# Patient Record
Sex: Female | Born: 1935 | Race: White | Hispanic: No | Marital: Married | Smoking: Never smoker
Health system: Southern US, Community
[De-identification: ages and names within clinical notes are randomized; demographics above are authoritative.]

## PROBLEM LIST (undated history)

## (undated) DIAGNOSIS — I10 Essential (primary) hypertension: Secondary | ICD-10-CM

## (undated) DIAGNOSIS — I639 Cerebral infarction, unspecified: Secondary | ICD-10-CM

## (undated) HISTORY — DX: Essential (primary) hypertension: I10

## (undated) HISTORY — PX: BRAIN SURGERY: SHX531

## (undated) HISTORY — DX: Cerebral infarction, unspecified: I63.9

---

## 2005-08-09 ENCOUNTER — Ambulatory Visit (HOSPITAL_COMMUNITY): Admission: RE | Admit: 2005-08-09 | Discharge: 2005-08-09 | Payer: Self-pay | Admitting: Family Medicine

## 2006-11-12 ENCOUNTER — Emergency Department (HOSPITAL_COMMUNITY): Admission: EM | Admit: 2006-11-12 | Discharge: 2006-11-12 | Payer: Self-pay | Admitting: Emergency Medicine

## 2006-11-15 ENCOUNTER — Ambulatory Visit: Payer: Self-pay | Admitting: Internal Medicine

## 2006-11-27 ENCOUNTER — Encounter (INDEPENDENT_AMBULATORY_CARE_PROVIDER_SITE_OTHER): Payer: Self-pay | Admitting: *Deleted

## 2006-11-27 ENCOUNTER — Ambulatory Visit: Payer: Self-pay | Admitting: Internal Medicine

## 2006-11-27 ENCOUNTER — Ambulatory Visit (HOSPITAL_COMMUNITY): Admission: RE | Admit: 2006-11-27 | Discharge: 2006-11-27 | Payer: Self-pay | Admitting: Internal Medicine

## 2010-04-08 ENCOUNTER — Ambulatory Visit: Payer: Self-pay | Admitting: Pulmonary Disease

## 2010-04-08 ENCOUNTER — Encounter (INDEPENDENT_AMBULATORY_CARE_PROVIDER_SITE_OTHER): Payer: Self-pay | Admitting: Neurosurgery

## 2010-04-08 ENCOUNTER — Encounter: Payer: Self-pay | Admitting: Emergency Medicine

## 2010-04-08 ENCOUNTER — Inpatient Hospital Stay (HOSPITAL_COMMUNITY): Admission: EM | Admit: 2010-04-08 | Discharge: 2010-04-12 | Payer: Self-pay | Admitting: Neurosurgery

## 2010-04-11 ENCOUNTER — Ambulatory Visit: Payer: Self-pay | Admitting: Physical Medicine & Rehabilitation

## 2010-04-12 ENCOUNTER — Ambulatory Visit: Payer: Self-pay | Admitting: Physical Medicine & Rehabilitation

## 2010-04-12 ENCOUNTER — Inpatient Hospital Stay (HOSPITAL_COMMUNITY)
Admission: RE | Admit: 2010-04-12 | Discharge: 2010-04-20 | Payer: Self-pay | Admitting: Physical Medicine & Rehabilitation

## 2010-04-12 ENCOUNTER — Encounter (INDEPENDENT_AMBULATORY_CARE_PROVIDER_SITE_OTHER): Payer: Self-pay | Admitting: Neurology

## 2010-04-26 ENCOUNTER — Encounter (HOSPITAL_COMMUNITY)
Admission: RE | Admit: 2010-04-26 | Discharge: 2010-05-26 | Payer: Self-pay | Admitting: Physical Medicine & Rehabilitation

## 2010-05-12 ENCOUNTER — Encounter: Admission: RE | Admit: 2010-05-12 | Discharge: 2010-05-12 | Payer: Self-pay | Admitting: Neurosurgery

## 2010-05-23 ENCOUNTER — Encounter
Admission: RE | Admit: 2010-05-23 | Discharge: 2010-05-26 | Payer: Self-pay | Admitting: Physical Medicine & Rehabilitation

## 2010-05-26 ENCOUNTER — Ambulatory Visit: Payer: Self-pay | Admitting: Physical Medicine & Rehabilitation

## 2010-06-24 ENCOUNTER — Encounter: Admission: RE | Admit: 2010-06-24 | Discharge: 2010-06-24 | Payer: Self-pay | Admitting: Neurosurgery

## 2010-11-18 LAB — GLUCOSE, CAPILLARY
Glucose-Capillary: 100 mg/dL — ABNORMAL HIGH (ref 70–99)
Glucose-Capillary: 103 mg/dL — ABNORMAL HIGH (ref 70–99)
Glucose-Capillary: 106 mg/dL — ABNORMAL HIGH (ref 70–99)
Glucose-Capillary: 107 mg/dL — ABNORMAL HIGH (ref 70–99)
Glucose-Capillary: 119 mg/dL — ABNORMAL HIGH (ref 70–99)
Glucose-Capillary: 122 mg/dL — ABNORMAL HIGH (ref 70–99)

## 2010-11-18 LAB — BLOOD GAS, ARTERIAL
Acid-base deficit: 0.7 mmol/L (ref 0.0–2.0)
Bicarbonate: 20.9 mEq/L (ref 20.0–24.0)
Bicarbonate: 22.2 mEq/L (ref 20.0–24.0)
Drawn by: 275531
Drawn by: 275531
Drawn by: 29017
FIO2: 0.3 %
FIO2: 0.3 %
O2 Saturation: 98.9 %
PEEP: 5 cmH2O
PEEP: 5 cmH2O
Patient temperature: 98.6
Pressure support: 5 cmH2O
RATE: 14 resp/min
RATE: 14 resp/min
pCO2 arterial: 28.8 mmHg — ABNORMAL LOW (ref 35.0–45.0)
pCO2 arterial: 36.9 mmHg (ref 35.0–45.0)
pH, Arterial: 7.372 (ref 7.350–7.400)
pO2, Arterial: 101 mmHg — ABNORMAL HIGH (ref 80.0–100.0)
pO2, Arterial: 190 mmHg — ABNORMAL HIGH (ref 80.0–100.0)

## 2010-11-18 LAB — CBC
HCT: 30.6 % — ABNORMAL LOW (ref 36.0–46.0)
Hemoglobin: 10.3 g/dL — ABNORMAL LOW (ref 12.0–15.0)
Hemoglobin: 11.1 g/dL — ABNORMAL LOW (ref 12.0–15.0)
MCH: 29.7 pg (ref 26.0–34.0)
MCH: 30 pg (ref 26.0–34.0)
MCH: 30.1 pg (ref 26.0–34.0)
MCH: 30.5 pg (ref 26.0–34.0)
MCHC: 33.2 g/dL (ref 30.0–36.0)
MCV: 88.9 fL (ref 78.0–100.0)
Platelets: 175 10*3/uL (ref 150–400)
Platelets: 188 10*3/uL (ref 150–400)
Platelets: 219 10*3/uL (ref 150–400)
Platelets: 233 10*3/uL (ref 150–400)
RBC: 3.03 MIL/uL — ABNORMAL LOW (ref 3.87–5.11)
RBC: 3.15 MIL/uL — ABNORMAL LOW (ref 3.87–5.11)
RBC: 3.43 MIL/uL — ABNORMAL LOW (ref 3.87–5.11)
RBC: 3.69 MIL/uL — ABNORMAL LOW (ref 3.87–5.11)
RDW: 12.8 % (ref 11.5–15.5)
RDW: 12.8 % (ref 11.5–15.5)
RDW: 13 % (ref 11.5–15.5)
WBC: 10.5 10*3/uL (ref 4.0–10.5)
WBC: 13.8 10*3/uL — ABNORMAL HIGH (ref 4.0–10.5)
WBC: 7.5 10*3/uL (ref 4.0–10.5)

## 2010-11-18 LAB — COMPREHENSIVE METABOLIC PANEL
ALT: 16 U/L (ref 0–35)
AST: 27 U/L (ref 0–37)
AST: 42 U/L — ABNORMAL HIGH (ref 0–37)
Albumin: 2.5 g/dL — ABNORMAL LOW (ref 3.5–5.2)
Albumin: 3.1 g/dL — ABNORMAL LOW (ref 3.5–5.2)
CO2: 22 mEq/L (ref 19–32)
Calcium: 8.3 mg/dL — ABNORMAL LOW (ref 8.4–10.5)
Chloride: 100 mEq/L (ref 96–112)
Creatinine, Ser: 0.72 mg/dL (ref 0.4–1.2)
Creatinine, Ser: 0.95 mg/dL (ref 0.4–1.2)
GFR calc Af Amer: >60 mL/min (ref >60)
GFR calc Af Amer: >60 mL/min (ref >60)
GFR calc non Af Amer: 57 mL/min — ABNORMAL LOW (ref >60)
Sodium: 133 mEq/L — ABNORMAL LOW (ref 135–145)
Total Bilirubin: 0.5 mg/dL (ref 0.3–1.2)
Total Protein: 5.6 g/dL — ABNORMAL LOW (ref 6.0–8.3)

## 2010-11-18 LAB — BASIC METABOLIC PANEL
BUN: 10 mg/dL (ref 6–23)
BUN: 10 mg/dL (ref 6–23)
BUN: 5 mg/dL — ABNORMAL LOW (ref 6–23)
CO2: 25 mEq/L (ref 19–32)
CO2: 29 mEq/L (ref 19–32)
Calcium: 8.8 mg/dL (ref 8.4–10.5)
Calcium: 8.1 mg/dL — ABNORMAL LOW (ref 8.4–10.5)
Calcium: 8.7 mg/dL (ref 8.4–10.5)
Calcium: 8.9 mg/dL (ref 8.4–10.5)
Chloride: 104 mEq/L (ref 96–112)
Creatinine, Ser: 0.77 mg/dL (ref 0.4–1.2)
Creatinine, Ser: 0.65 mg/dL (ref 0.4–1.2)
Creatinine, Ser: 0.75 mg/dL (ref 0.4–1.2)
GFR calc Af Amer: >60 mL/min (ref >60)
GFR calc Af Amer: >60 mL/min (ref >60)
GFR calc Af Amer: >60 mL/min (ref >60)
GFR calc non Af Amer: >60 mL/min (ref >60)
GFR calc non Af Amer: 57 mL/min — ABNORMAL LOW (ref >60)
GFR calc non Af Amer: >60 mL/min (ref >60)
Glucose, Bld: 100 mg/dL — ABNORMAL HIGH (ref 70–99)
Potassium: 3.6 mEq/L (ref 3.5–5.1)
Potassium: 3.8 mEq/L (ref 3.5–5.1)
Sodium: 135 mEq/L (ref 135–145)
Sodium: 137 mEq/L (ref 135–145)
Sodium: 137 mEq/L (ref 135–145)

## 2010-11-18 LAB — APTT: aPTT: 23 seconds — ABNORMAL LOW (ref 24–37)

## 2010-11-18 LAB — CROSSMATCH
ABO/RH(D): A POS
Antibody Screen: NEGATIVE

## 2010-11-18 LAB — POCT I-STAT 7, (LYTES, BLD GAS, ICA,H+H)
Bicarbonate: 27.3 mEq/L — ABNORMAL HIGH (ref 20.0–24.0)
Hemoglobin: 11.9 g/dL — ABNORMAL LOW (ref 12.0–15.0)
Sodium: 130 mEq/L — ABNORMAL LOW (ref 135–145)
TCO2: 29 mmol/L (ref 0–100)
pH, Arterial: 7.417 — ABNORMAL HIGH (ref 7.350–7.400)
pO2, Arterial: 488 mmHg — ABNORMAL HIGH (ref 80.0–100.0)

## 2010-11-18 LAB — DIFFERENTIAL
Basophils Absolute: 0 10*3/uL (ref 0.0–0.1)
Basophils Relative: 0 % (ref 0–1)
Eosinophils Relative: 1 % (ref 0–5)
Lymphocytes Relative: 18 % (ref 12–46)
Lymphocytes Relative: 18 % (ref 12–46)
Lymphs Abs: 1.1 10*3/uL (ref 0.7–4.0)
Monocytes Absolute: 0.7 10*3/uL (ref 0.1–1.0)
Neutro Abs: 4.3 10*3/uL (ref 1.7–7.7)
Neutro Abs: 7.8 10*3/uL — ABNORMAL HIGH (ref 1.7–7.7)

## 2010-11-18 LAB — LIPID PANEL
HDL: 38 mg/dL — ABNORMAL LOW (ref >39)
Total CHOL/HDL Ratio: 4.2 RATIO
Triglycerides: 90 mg/dL (ref <150)

## 2010-11-18 LAB — PHOSPHORUS
Phosphorus: 1.7 mg/dL — ABNORMAL LOW (ref 2.3–4.6)
Phosphorus: 2.6 mg/dL (ref 2.3–4.6)

## 2010-11-18 LAB — HEMOGLOBIN A1C
Hgb A1c MFr Bld: 5.9 % — ABNORMAL HIGH (ref <5.7)
Mean Plasma Glucose: 123 mg/dL — ABNORMAL HIGH (ref <117)

## 2010-11-18 LAB — MAGNESIUM: Magnesium: 2.3 mg/dL (ref 1.5–2.5)

## 2010-11-18 LAB — MRSA PCR SCREENING: MRSA by PCR: NEGATIVE

## 2010-11-18 LAB — HEMOCCULT GUIAC POC 1CARD (OFFICE): Fecal Occult Bld: NEGATIVE

## 2010-11-18 LAB — PROTIME-INR: Prothrombin Time: 13.2 seconds (ref 11.6–15.2)

## 2011-01-20 NOTE — Op Note (Signed)
NAMESHEVA, MCDOUGLE                  ACCOUNT NO.:  [id number]   MEDICAL RECORD NO.:  [id number]          PATIENT TYPE:  AMB   LOCATION:  DAY                           FACILITY:  [facility]   PHYSICIAN:  Lionel December, M.D.    DATE OF BIRTH:  1930-03-26   DATE OF PROCEDURE:  11/27/2006  DATE OF DISCHARGE:                               OPERATIVE REPORT   PROCEDURE:  Colonoscopy.   INDICATION:  Zeriyah is a 77 year old Caucasian female who is undergoing  average risk screening colonoscopy.  Procedure risks were reviewed with  the patient and informed consent was obtained.   MEDS CONSCIOUS SEDATION:  1. Demerol 50 mg IV.  2. Versed 6 mg IV.  3. Xylocaine jelly was applied to anal canal for perianal discomfort.   FINDINGS:  Procedure performed in endoscopy suite.  The patient's vital  signs and O2 sat were monitored during procedure and remained stable.  The patient was placed in the left lateral recumbent position and rectal  examination performed.  No abnormality noted on external or digital  exam.  A Pentax video scope was placed in the rectum and advanced under  vision the sigmoid colon and beyond.  Preparation was excellent.  A few  scattered diverticula noted at sigmoid and descending colon.  The scope  was passed in the cecum which was examined by appendiceal orifice and  ileocecal valve.  Pictures taken for the record.  As the scope was  withdrawn, colonic mucosa was completely examined.  There was a small  polyp at splenic flexure which was ablated by cold biopsy and other  slightly larger polyp at the proximal sigmoid colon was ablated in  similar fashion.  Mucosa, rest of the sigmoid colon was normal.  Rectal  mucosa similarly was normal.  Scope was retroflexed to examine anorectal  junction and there was some thickening to the mucosa of anal canal  though it is normal.  Endoscope is straightened and withdrawn.  Patient  tolerated the procedure well.   FINAL DIAGNOSES:  1.  Scattered diverticula in the sigmoid and descending colon (mild).  2. Two small polyps ablated via cold biopsy, one from splenic flexure      and second one from sigmoid colon.   RECOMMENDATIONS:  1. High fiber diet.  2. Fiber supplement 3-4 g daily.  3. Mycolog 2 cream to be applied to perianal area b.i.d. for 2 weeks.      A prescription given for 60 g with 1 refill.      Lionel December, M.D.  Electronically Signed     NR/MEDQ  D:  11/27/2006  T:  11/27/2006  Job:  [id number]   cc:   Donna Bernard, M.D.  Fax: [phone]

## 2011-08-13 IMAGING — CT CT HEAD W/O CM
1 series · 15 of 30 positions shown, 19 images · non-contrast
Comparison: None.

CLINICAL DATA: Right-sided headache for 2 hours.

CT HEAD WITHOUT CONTRAST
TECHNIQUE: Contiguous axial images were obtained from the base of
the skull through the vertex without contrast.

[Series 2: headseq 4.8 h37s · axial · 0.50mm/px · z∈[+165,+328]mm · 15 of 36 slices shown, 19 images]
[im 2/36  brain]
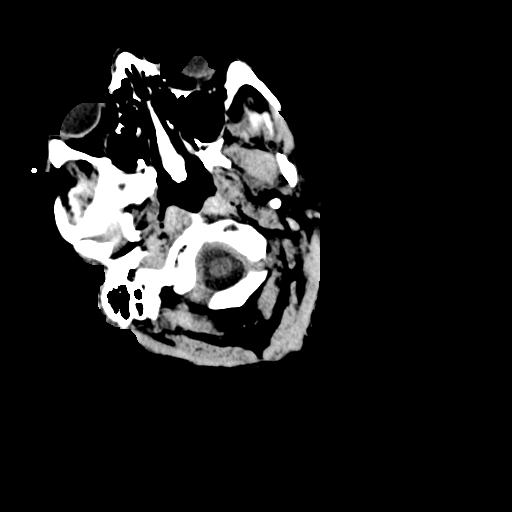
[im 2/36  bone]
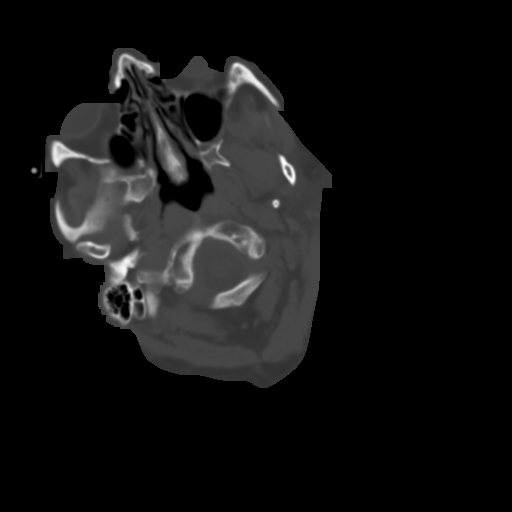
[im 4/36  brain]
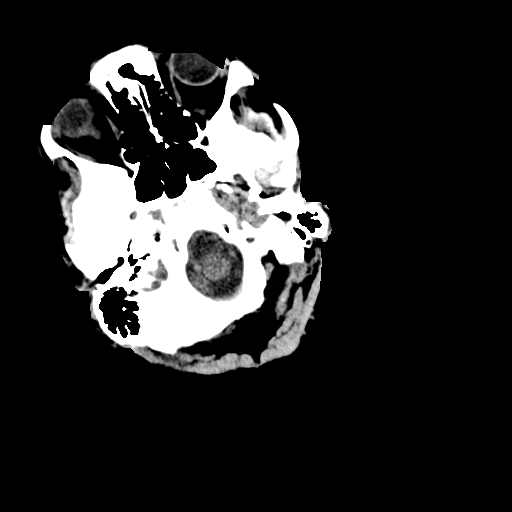
[im 7/36  brain]
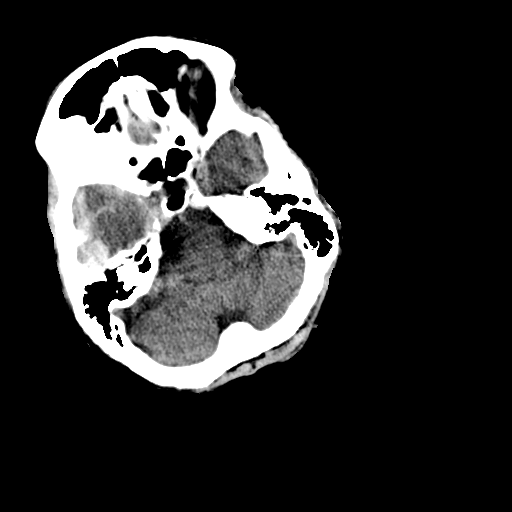
[im 9/36  brain]
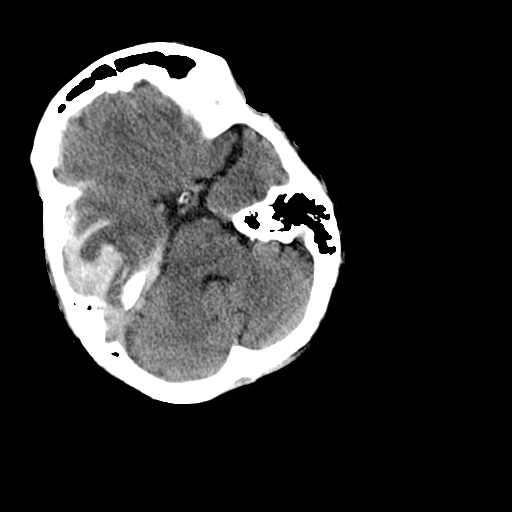
[im 11/36  brain]
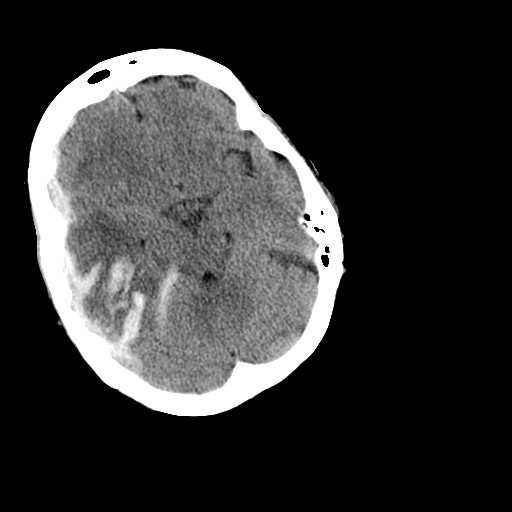
[im 11/36  bone]
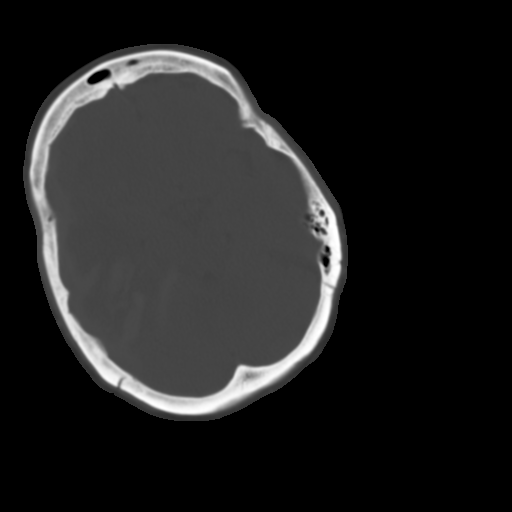
[im 14/36  brain]
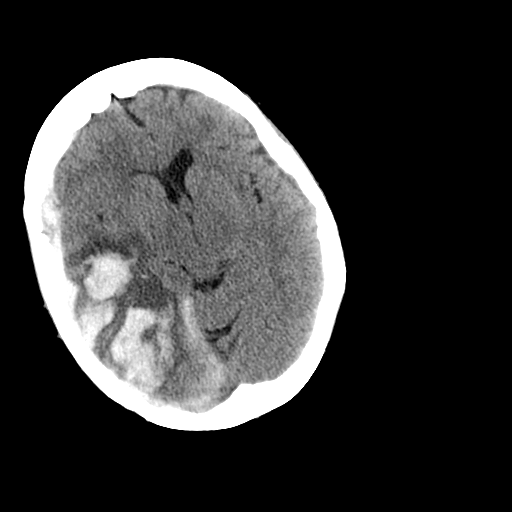
[im 16/36  brain]
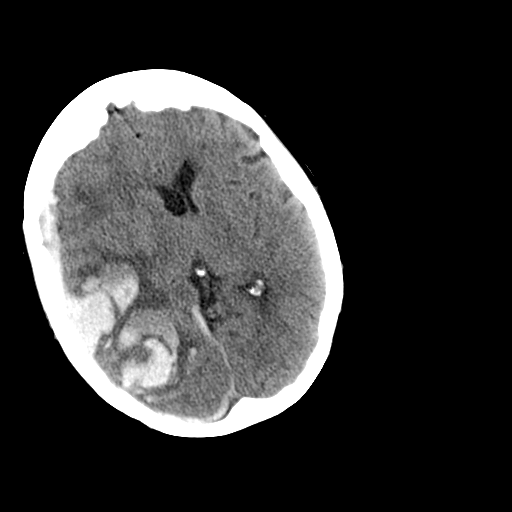
[im 19/36  brain]
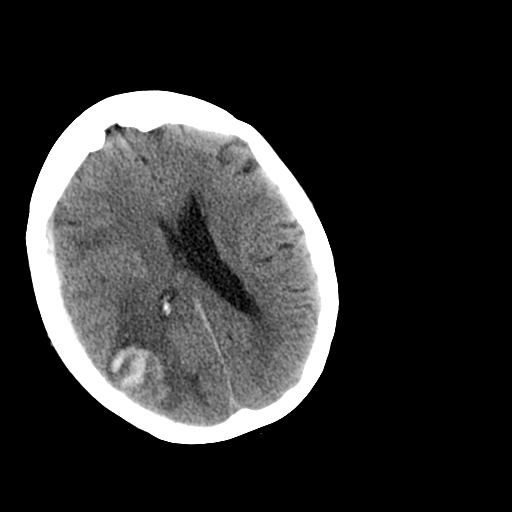
[im 20/36  brain]
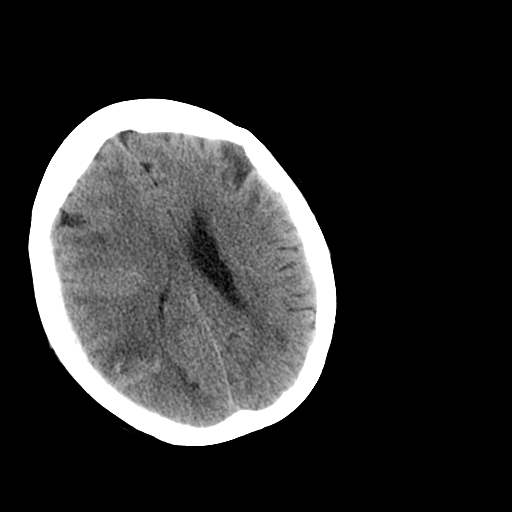
[im 20/36  bone]
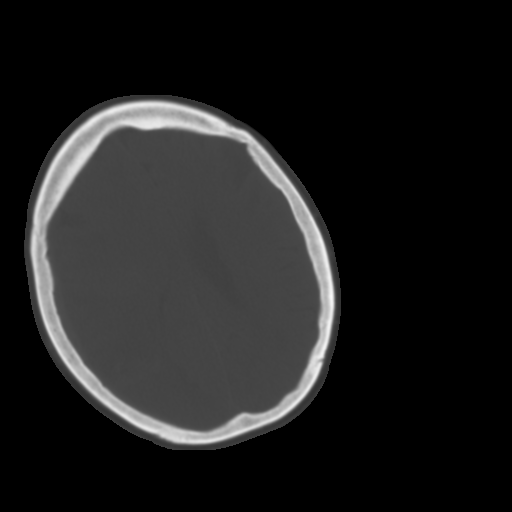
[im 22/36  brain]
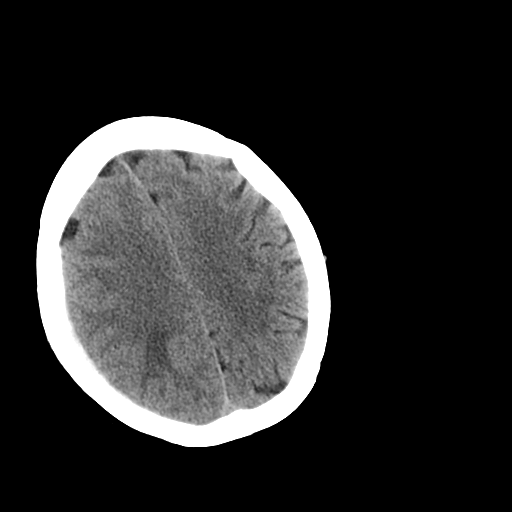
[im 25/36  brain]
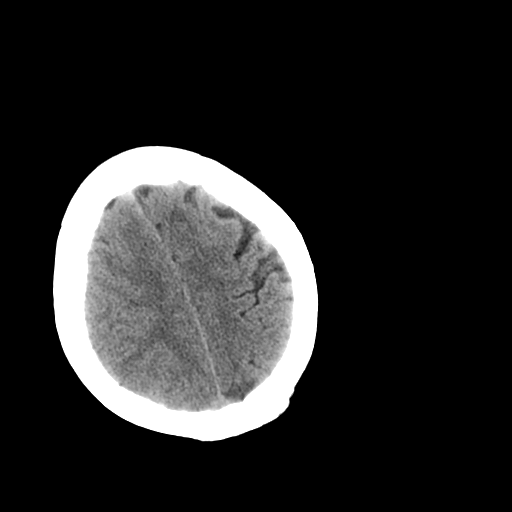
[im 27/36  brain]
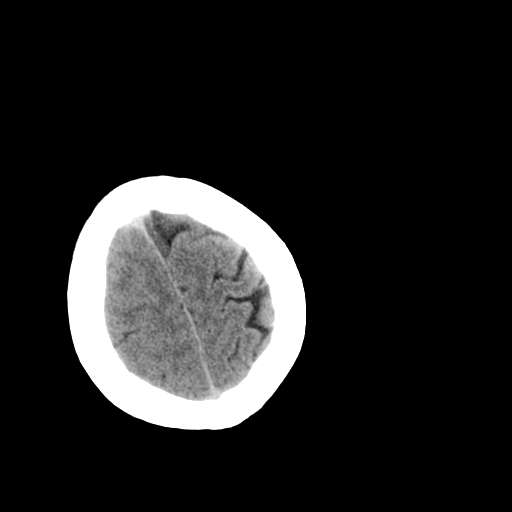
[im 29/36  brain]
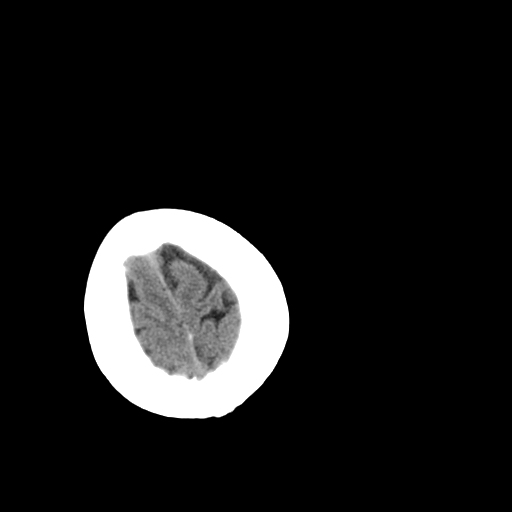
[im 29/36  bone]
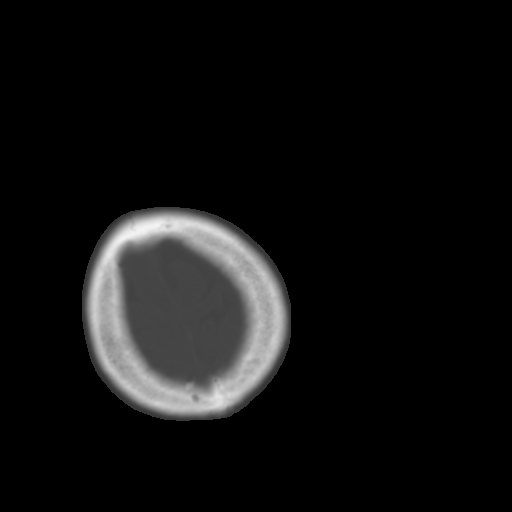
[im 32/36  brain]
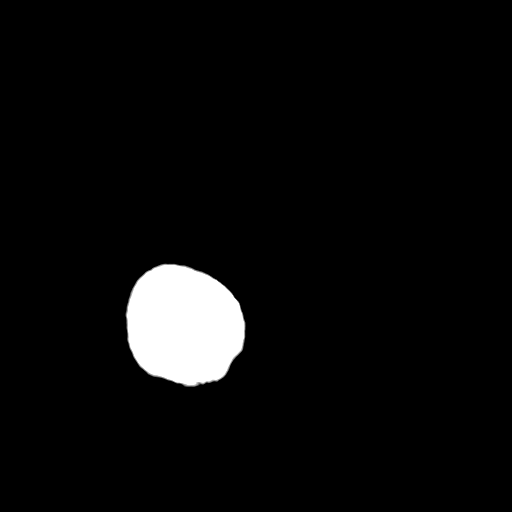
[im 34/36  brain]
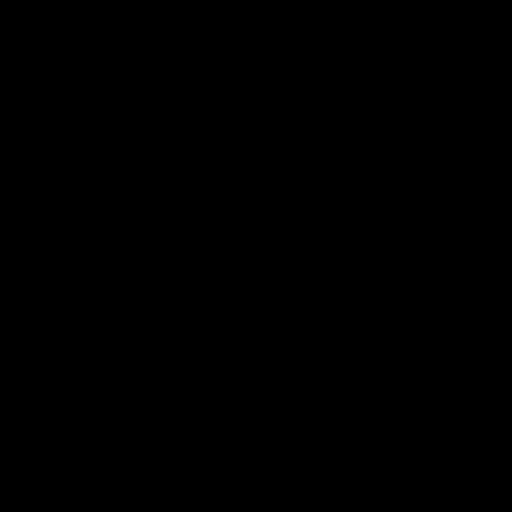

[15 of 30 positions shown; findings below may reference images not displayed]

FINDINGS: There is a large acute right-sided hemorrhagic infarct,
involving the right temporal, parietal and occipital lobes.
Associated subdural, subarachnoid and intraparenchymal hemorrhage
is noted.

The intraparenchymal bleed measures approximately 6.7 x 4.0 cm,
while the subdural bleed measures up to 1.0 cm in thickness.
Subdural blood tracks along the right side of the tentorium
cerebelli.  Significant surrounding vasogenic edema is noted; there
is approximately 1 cm leftward midline shift.  There is effacement
of most of the right lateral ventricle and partial effacement of
the third ventricle.

No definite intraventricular blood is characterized.  The
appearance of this bleed suggests an infarct, rather than an
underlying mass. The quadrigeminal cistern is mildly flattened on
the right, without evidence of transtentorial or subfalcine
herniation at this time.  The posterior fossa, including the
cerebellum, brainstem and fourth ventricle, is within normal
limits.

There is no evidence of fracture; visualized osseous structures are
unremarkable in appearance.  The visualized portions of the orbits
are within normal limits.  The paranasal sinuses and mastoid air
cells are well-aerated.  No significant soft tissue abnormalities
are seen.
IMPRESSION: Large acute right-sided hemorrhagic infarct, involving the right
temporal, parietal and occipital lobes, with associated subdural,
subarachnoid and marked intraparenchymal hemorrhage.  Subdural
blood tracks along the right tentorium cerebelli; significant
surrounding vasogenic edema, with approximately 1 cm leftward
midline shift.

Effacement of most of the right lateral ventricle and partial
effacement of the third ventricle.  No definite evidence of
transtentorial or subfalcine herniation at this time.

Critical test results telephoned to [REDACTED] at the time of

## 2011-11-29 DIAGNOSIS — M1710 Unilateral primary osteoarthritis, unspecified knee: Secondary | ICD-10-CM | POA: Diagnosis not present

## 2011-12-06 ENCOUNTER — Encounter (INDEPENDENT_AMBULATORY_CARE_PROVIDER_SITE_OTHER): Payer: Self-pay | Admitting: *Deleted

## 2011-12-20 DIAGNOSIS — M1710 Unilateral primary osteoarthritis, unspecified knee: Secondary | ICD-10-CM | POA: Diagnosis not present

## 2012-01-22 DIAGNOSIS — R7301 Impaired fasting glucose: Secondary | ICD-10-CM | POA: Diagnosis not present

## 2012-01-22 DIAGNOSIS — I10 Essential (primary) hypertension: Secondary | ICD-10-CM | POA: Diagnosis not present

## 2012-01-22 DIAGNOSIS — E782 Mixed hyperlipidemia: Secondary | ICD-10-CM | POA: Diagnosis not present

## 2012-01-22 DIAGNOSIS — F411 Generalized anxiety disorder: Secondary | ICD-10-CM | POA: Diagnosis not present

## 2012-01-22 DIAGNOSIS — E785 Hyperlipidemia, unspecified: Secondary | ICD-10-CM | POA: Diagnosis not present

## 2012-01-22 DIAGNOSIS — F410 Panic disorder [episodic paroxysmal anxiety] without agoraphobia: Secondary | ICD-10-CM | POA: Diagnosis not present

## 2012-02-01 DIAGNOSIS — H53469 Homonymous bilateral field defects, unspecified side: Secondary | ICD-10-CM | POA: Diagnosis not present

## 2012-02-01 DIAGNOSIS — I619 Nontraumatic intracerebral hemorrhage, unspecified: Secondary | ICD-10-CM | POA: Diagnosis not present

## 2012-05-28 DIAGNOSIS — H2510 Age-related nuclear cataract, unspecified eye: Secondary | ICD-10-CM | POA: Diagnosis not present

## 2012-06-13 DIAGNOSIS — Z23 Encounter for immunization: Secondary | ICD-10-CM | POA: Diagnosis not present

## 2012-07-24 DIAGNOSIS — R5383 Other fatigue: Secondary | ICD-10-CM | POA: Diagnosis not present

## 2012-07-24 DIAGNOSIS — Z8679 Personal history of other diseases of the circulatory system: Secondary | ICD-10-CM | POA: Diagnosis not present

## 2012-07-24 DIAGNOSIS — I10 Essential (primary) hypertension: Secondary | ICD-10-CM | POA: Diagnosis not present

## 2012-07-24 DIAGNOSIS — E782 Mixed hyperlipidemia: Secondary | ICD-10-CM | POA: Diagnosis not present

## 2012-07-24 DIAGNOSIS — E785 Hyperlipidemia, unspecified: Secondary | ICD-10-CM | POA: Diagnosis not present

## 2012-07-24 DIAGNOSIS — R7309 Other abnormal glucose: Secondary | ICD-10-CM | POA: Diagnosis not present

## 2012-07-24 DIAGNOSIS — R5381 Other malaise: Secondary | ICD-10-CM | POA: Diagnosis not present

## 2013-01-15 ENCOUNTER — Encounter: Payer: Self-pay | Admitting: Orthopedic Surgery

## 2013-01-15 ENCOUNTER — Ambulatory Visit (INDEPENDENT_AMBULATORY_CARE_PROVIDER_SITE_OTHER): Payer: Medicare Other

## 2013-01-15 ENCOUNTER — Ambulatory Visit (INDEPENDENT_AMBULATORY_CARE_PROVIDER_SITE_OTHER): Payer: Medicare Other | Admitting: Orthopedic Surgery

## 2013-01-15 VITALS — BP 180/78 | Ht 66.0 in | Wt 156.0 lb

## 2013-01-15 DIAGNOSIS — M25569 Pain in unspecified knee: Secondary | ICD-10-CM | POA: Diagnosis not present

## 2013-01-15 DIAGNOSIS — M1710 Unilateral primary osteoarthritis, unspecified knee: Secondary | ICD-10-CM

## 2013-01-15 DIAGNOSIS — M170 Bilateral primary osteoarthritis of knee: Secondary | ICD-10-CM | POA: Insufficient documentation

## 2013-01-15 MED ORDER — TRAMADOL-ACETAMINOPHEN 37.5-325 MG PO TABS: 1.0000 | ORAL_TABLET | ORAL | Status: DC | PRN

## 2013-01-15 NOTE — Progress Notes (Signed)
  Subjective:    Patient ID: Chelsea Pratt, female    DOB: 1930/02/15, 79 y.o.   MRN: [id number]  HPI Comments: This is an 79 year old female status post craniotomy for right hemorrhagic stroke approximately 3 years ago now presents with bilateral knee pain for several years with gradual onset 5/10 pain which seems to come and go. Most the pain is in the front of her knee and the main problem she is having is getting out of a chair or stepping up on a step. She's not allowed to take NSAIDs after hemorrhagic stroke although her surgeon said that she can take an Advil as needed. Her pain is worse with inclement weather she has some locking and catching episodes no swelling and no pain when she's walking just when she's climbing or getting out of a chair  No known drug allergies  Hypertension as a medical problem  Surgery as stated  Dr. Dwana Melena is the primary care physician  Knee Pain       Review of Systems  Constitutional: Positive for fatigue.  Allergic/Immunologic: Positive for environmental allergies.  Neurological: Positive for dizziness.  Psychiatric/Behavioral: The patient is nervous/anxious.        Objective:   Physical Exam BP 180/78  Ht 5\' 6"  (1.676 m)  Wt 156 lb (70.761 kg)  BMI 25.19 kg/m2 General appearance is normal, the patient is alert and oriented x3 with normal mood and affect. She does exhibit some short-term memory loss. She ambulates without assistive device but seems to be very frail and her stats  She does not seem to have any difficulties with her upper extremities. Limbs are aligned normally range of motion is full without contracture she has no joint subluxations muscle tone is normal without atrophy and skin is normal  She has slight valgus alignment to her knees. She had difficulty getting up on the exam table. However she's maintained her range of motion with crepitance and pain with patellofemoral compression. I cannot detect any instability. She did  extension power good hip flexion power skin was normal.  She did have some varicose veins slight peripheral edema bilaterally. Sensation was normal in each leg and her reflexes were 2+ at each knee and 0 at each ankle with downgoing toes there is no balance issue or coordination problem.        Assessment & Plan:  X-rays in our office show mild tibiofemoral arthritis left worse than right but severe patellofemoral arthritis grade 4 bilaterally  The patient does not want to have knee replacement surgery  Our compromise is bilateral cortisone injection, physical therapy to improve quadriceps strength, Ultracet for pain

## 2013-01-15 NOTE — Patient Instructions (Signed)
Physical therapy at the hospital  Cortisone injection bilaterally  Ultracet for pain

## 2013-01-17 DIAGNOSIS — I10 Essential (primary) hypertension: Secondary | ICD-10-CM | POA: Diagnosis not present

## 2013-01-17 DIAGNOSIS — F410 Panic disorder [episodic paroxysmal anxiety] without agoraphobia: Secondary | ICD-10-CM | POA: Diagnosis not present

## 2013-01-17 DIAGNOSIS — E785 Hyperlipidemia, unspecified: Secondary | ICD-10-CM | POA: Diagnosis not present

## 2013-01-17 DIAGNOSIS — R7301 Impaired fasting glucose: Secondary | ICD-10-CM | POA: Diagnosis not present

## 2013-01-17 DIAGNOSIS — E782 Mixed hyperlipidemia: Secondary | ICD-10-CM | POA: Diagnosis not present

## 2013-01-22 ENCOUNTER — Ambulatory Visit (HOSPITAL_COMMUNITY)
Admission: RE | Admit: 2013-01-22 | Discharge: 2013-01-22 | Disposition: A | Discharge status: Home / Self Care | Payer: Medicare Other | Source: Ambulatory Visit | Attending: Orthopedic Surgery | Admitting: Orthopedic Surgery

## 2013-01-22 DIAGNOSIS — IMO0001 Reserved for inherently not codable concepts without codable children: Secondary | ICD-10-CM | POA: Insufficient documentation

## 2013-01-22 DIAGNOSIS — I10 Essential (primary) hypertension: Secondary | ICD-10-CM | POA: Diagnosis not present

## 2013-01-22 DIAGNOSIS — M25569 Pain in unspecified knee: Secondary | ICD-10-CM | POA: Diagnosis not present

## 2013-01-22 DIAGNOSIS — R262 Difficulty in walking, not elsewhere classified: Secondary | ICD-10-CM | POA: Diagnosis not present

## 2013-01-22 NOTE — Evaluation (Signed)
Physical Therapy Evaluation  Patient Details  Name: Chelsea Pratt MRN: [id number] Date of Birth: April 22, 1930  Today's Date: 01/22/2013 Time: 1345-1430 PT Time Calculation (min): 45 min              Visit#: 1 of 1  Re-eval:   Assessment Diagnosis: OA Prior Therapy: none   Authorization: Medicare    Past Medical History:  Past Medical History  Diagnosis Date  . HTN (hypertension)    Past Surgical History:  Past Surgical History  Procedure Laterality Date  . Brain surgery      Subjective Symptoms/Limitations Symptoms: Ms. Doebler states that he knees have been bothering her for years but the pain has became significant enough that is is affecting her lifestyle  therefore she sought medical advise.  Her husband states that she can barely get herself out of a chair.  She states her MD states that she needs a knee replacement but she does not want to be put to sleep again. Limitations: Sitting How long can you sit comfortably?: The pateint has no difficulty sitting. How long can you stand comfortably?: no difficultty standing How long can you walk comfortably?: Pt states that she walks for 30-40 minutes every day and they do not hurt then they mainly hurt getting up and down. Pain Assessment Currently in Pain?: Yes (worst pain is a 5/10) Pain Score: 0-No pain Pain Orientation: Right;Left;Anterior Pain Type: Chronic pain Pain Onset: More than a month ago Pain Frequency: Intermittent Pain Relieving Factors: cortisone shot but temporary reilef; pt only has pain relief for a few days. Effect of Pain on Daily Activities: increases  Prior Functioning  Prior Function Level of Independence: Independent with basic ADLs  Assessment RLE Strength Right Hip Flexion: 5/5 Right Hip Extension: 3/5 Right Hip ABduction: 5/5 Right Hip ADduction: 5/5 Right Knee Flexion: 3+/5 Right Knee Extension: 3+/5 Right Ankle Dorsiflexion: 5/5 LLE Strength Left Hip Flexion: 5/5 Left Hip Extension:  3/5 Left Hip ABduction: 5/5 Left Knee Flexion: 3+/5 Left Knee Extension: 3+/5 Left Ankle Dorsiflexion: 5/5   Physical Therapy Assessment and Plan PT Assessment and Plan Clinical Impression Statement: Pt with OA in B knees that cause significant pain when going sit to stand.  Pt states that walking, standing and sitting is not affected.  Pt has been referred for a one time visit for a HEP to help improve the patients strength. Rehab Potential: Good PT Frequency: Min 1X/week PT Duration:  (1 week ) PT Plan: MD requested 1 visit only.    Goals Home Exercise Program Pt will Perform Home Exercise Program: Independently  Problem List Patient Active Problem List   Diagnosis Date Noted  . Osteoarthritis of both knees 01/15/2013    PT Plan of Care PT Home Exercise Plan: given  GP Functional Limitation: Mobility: Walking and moving around Mobility: Walking and Moving Around Current Status [phone]): At least 40 percent but less than 60 percent impaired, limited or restricted Mobility: Walking and Moving Around Goal Status [phone]): At least 40 percent but less than 60 percent impaired, limited or restricted Mobility: Walking and Moving Around Discharge Status [phone]): At least 40 percent but less than 60 percent impaired, limited or restricted  RUSSELL,CINDY 01/22/2013, 3:13 PM  Physician Documentation Your signature is required to indicate approval of the treatment plan as stated above.  Please sign and either send electronically or make a copy of this report for your files and return this physician signed original.   Please mark one 1.__approve of plan  2. ___approve of plan with the following conditions.   ______________________________                                                          _____________________ Physician Signature                                                                                                             Date  

## 2013-01-28 ENCOUNTER — Telehealth (HOSPITAL_COMMUNITY): Payer: Self-pay

## 2013-01-29 ENCOUNTER — Ambulatory Visit (HOSPITAL_COMMUNITY): Payer: Medicare Other | Admitting: Physical Therapy

## 2013-01-30 ENCOUNTER — Ambulatory Visit: Payer: Self-pay | Admitting: Nurse Practitioner

## 2013-01-31 ENCOUNTER — Ambulatory Visit (HOSPITAL_COMMUNITY): Payer: Medicare Other | Admitting: Physical Therapy

## 2013-02-04 ENCOUNTER — Ambulatory Visit (HOSPITAL_COMMUNITY): Payer: Medicare Other | Admitting: *Deleted

## 2013-02-06 ENCOUNTER — Ambulatory Visit (HOSPITAL_COMMUNITY): Payer: Medicare Other | Admitting: *Deleted

## 2013-02-11 ENCOUNTER — Ambulatory Visit (HOSPITAL_COMMUNITY): Payer: Medicare Other | Admitting: *Deleted

## 2013-02-13 ENCOUNTER — Ambulatory Visit (HOSPITAL_COMMUNITY): Payer: Medicare Other | Admitting: *Deleted

## 2013-02-18 ENCOUNTER — Ambulatory Visit (HOSPITAL_COMMUNITY): Payer: Medicare Other | Admitting: Physical Therapy

## 2013-02-20 ENCOUNTER — Ambulatory Visit (HOSPITAL_COMMUNITY): Payer: Medicare Other | Admitting: Physical Therapy

## 2013-02-25 ENCOUNTER — Ambulatory Visit (HOSPITAL_COMMUNITY): Payer: Medicare Other | Admitting: *Deleted

## 2013-02-27 ENCOUNTER — Ambulatory Visit (HOSPITAL_COMMUNITY): Payer: Medicare Other | Admitting: *Deleted

## 2013-03-10 ENCOUNTER — Ambulatory Visit: Payer: Self-pay | Admitting: Nurse Practitioner

## 2013-03-11 ENCOUNTER — Encounter: Payer: Self-pay | Admitting: Nurse Practitioner

## 2013-03-12 ENCOUNTER — Ambulatory Visit (INDEPENDENT_AMBULATORY_CARE_PROVIDER_SITE_OTHER): Payer: Medicare Other | Admitting: Nurse Practitioner

## 2013-03-12 ENCOUNTER — Encounter: Payer: Self-pay | Admitting: Nurse Practitioner

## 2013-03-12 VITALS — BP 150/80 | HR 73 | Ht 65.0 in | Wt 152.5 lb

## 2013-03-12 DIAGNOSIS — I619 Nontraumatic intracerebral hemorrhage, unspecified: Secondary | ICD-10-CM | POA: Diagnosis not present

## 2013-03-12 DIAGNOSIS — H53462 Homonymous bilateral field defects, left side: Secondary | ICD-10-CM

## 2013-03-12 DIAGNOSIS — H53469 Homonymous bilateral field defects, unspecified side: Secondary | ICD-10-CM | POA: Diagnosis not present

## 2013-03-12 NOTE — Progress Notes (Signed)
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 Etiology probably hypertensive.   She returns for followup after last visit 02/01/12.  She continues to do well and has not had any significant headaches and even the burning sensation in her left temple has resolved. She states her short-term memory difficulties continue to be minor and unchanged. Her blood pressure apparently is quite good at home but today in our office is elevated at 165/77. She has noted improvement in her left-sided peripheral vision as documented by an optometrist .She has been driving but with her husband on her side and has not had any problems.  She has no new complaints today.  ROS:  Fatigue, aching muscles, shortness of breath, dizziness  Physical Exam General: well developed, well nourished, seated, in no evident distress Head: head normocephalic and atraumatic. Oropharynx benign Neck: supple with no carotid  bruits Cardiovascular: regular rate and rhythm, no murmurs  Neurologic Exam Mental Status: Awake and fully alert. Oriented to place and time. Follows all commands. Speech and language normal.   Cranial Nerves:  Pupils equal, briskly reactive to light. Extraocular movements full without nystagmus. Visual fields show mild field cut on the left to confrontation. Hearing intact and symmetric to finger snap. Facial sensation intact. Face, tongue, palate move normally and symmetrically. Neck flexion and extension normal.  Motor: Normal bulk and tone. Normal strength in all tested extremity muscles.No focal weakness. No drift Sensory.: intact to touch and pinprick and vibratory.  Coordination: Rapid alternating movements normal in all extremities. Finger-to-nose and heel-to-shin performed accurately bilaterally. No dysmetria Gait and Station:  Arises from chair without difficulty. Stance is normal. Gait demonstrates normal stride length and balance . Able to heel, toe and mildly unsteady with tandem walk.  Reflexes: 2+ and symmetric. Toes downgoing.     ASSESSMENT: Right temporal parietal parenchymal brain hemorrhage in August 2011 status post craniotomy for hematoma evacuation with excellent clinical recovery without any residual deficits except mild left field cut. Etiology probably hypertensive     PLAN: Continue present treatment with strict control of hypertension with systolic blood pressure: 130. Patient says she keeps a log and it is generally below 130 Continue aspirin 81 daily Carotid Doppler negative for stenosis 08/01/2011 Fall risk=9, be careful with ambulation Followup yearly and when necessary Nilda Riggs, GNP-BC APRN

## 2013-03-12 NOTE — Patient Instructions (Addendum)
Continue aspirin 81 daily.  Continue to monitor her pressure with systolic less than 130 Followup yearly and when necessary

## 2013-03-28 DIAGNOSIS — I10 Essential (primary) hypertension: Secondary | ICD-10-CM | POA: Diagnosis not present

## 2013-03-28 DIAGNOSIS — F411 Generalized anxiety disorder: Secondary | ICD-10-CM | POA: Diagnosis not present

## 2013-05-08 DIAGNOSIS — D235 Other benign neoplasm of skin of trunk: Secondary | ICD-10-CM | POA: Diagnosis not present

## 2013-05-08 DIAGNOSIS — I781 Nevus, non-neoplastic: Secondary | ICD-10-CM | POA: Diagnosis not present

## 2013-05-08 DIAGNOSIS — L821 Other seborrheic keratosis: Secondary | ICD-10-CM | POA: Diagnosis not present

## 2013-06-13 DIAGNOSIS — Z23 Encounter for immunization: Secondary | ICD-10-CM | POA: Diagnosis not present

## 2013-07-18 DIAGNOSIS — N952 Postmenopausal atrophic vaginitis: Secondary | ICD-10-CM | POA: Diagnosis not present

## 2013-07-18 DIAGNOSIS — E785 Hyperlipidemia, unspecified: Secondary | ICD-10-CM | POA: Diagnosis not present

## 2013-07-18 DIAGNOSIS — E782 Mixed hyperlipidemia: Secondary | ICD-10-CM | POA: Diagnosis not present

## 2013-07-18 DIAGNOSIS — F411 Generalized anxiety disorder: Secondary | ICD-10-CM | POA: Diagnosis not present

## 2013-07-18 DIAGNOSIS — R7309 Other abnormal glucose: Secondary | ICD-10-CM | POA: Diagnosis not present

## 2013-07-18 DIAGNOSIS — I10 Essential (primary) hypertension: Secondary | ICD-10-CM | POA: Diagnosis not present

## 2014-01-02 DIAGNOSIS — I10 Essential (primary) hypertension: Secondary | ICD-10-CM | POA: Diagnosis not present

## 2014-01-02 DIAGNOSIS — Z79899 Other long term (current) drug therapy: Secondary | ICD-10-CM | POA: Diagnosis not present

## 2014-01-02 DIAGNOSIS — E782 Mixed hyperlipidemia: Secondary | ICD-10-CM | POA: Diagnosis not present

## 2014-01-02 DIAGNOSIS — F411 Generalized anxiety disorder: Secondary | ICD-10-CM | POA: Diagnosis not present

## 2014-01-02 DIAGNOSIS — E785 Hyperlipidemia, unspecified: Secondary | ICD-10-CM | POA: Diagnosis not present

## 2014-01-02 DIAGNOSIS — M899 Disorder of bone, unspecified: Secondary | ICD-10-CM | POA: Diagnosis not present

## 2014-01-02 DIAGNOSIS — R7309 Other abnormal glucose: Secondary | ICD-10-CM | POA: Diagnosis not present

## 2014-02-03 DIAGNOSIS — H2510 Age-related nuclear cataract, unspecified eye: Secondary | ICD-10-CM | POA: Diagnosis not present

## 2014-03-12 ENCOUNTER — Ambulatory Visit (INDEPENDENT_AMBULATORY_CARE_PROVIDER_SITE_OTHER): Payer: Medicare Other | Admitting: Nurse Practitioner

## 2014-03-12 ENCOUNTER — Encounter: Payer: Self-pay | Admitting: Nurse Practitioner

## 2014-03-12 ENCOUNTER — Encounter (INDEPENDENT_AMBULATORY_CARE_PROVIDER_SITE_OTHER): Payer: Self-pay

## 2014-03-12 VITALS — BP 140/70 | HR 77 | Ht 65.5 in | Wt 154.0 lb

## 2014-03-12 DIAGNOSIS — H53469 Homonymous bilateral field defects, unspecified side: Secondary | ICD-10-CM

## 2014-03-12 DIAGNOSIS — H53462 Homonymous bilateral field defects, left side: Secondary | ICD-10-CM

## 2014-03-12 NOTE — Progress Notes (Signed)
[facility]  PATIENT: Chelsea Pratt DOB: [birth year]   REASON FOR VISIT: Followup for  history of right brain hemorrhage in August 2011   HISTORY OF PRESENT ILLNESS:83 year Caucasian lady with right temporopareital parenchymal brain hemorrhage in August 2011 s/p craniotomy for hematoma evacuation with excellent clinical recovery without any residual deficits except mild left field cut. Etiology probably hypertensive.  She returns for followup after last visit 03/12/13.  She continues to do well and has not had any significant headaches and even the burning sensation in her left temple has resolved. She states her short-term memory difficulties continue to be minor and unchanged. Her blood pressure apparently is quite good at home but today in our office is elevated at 140/70. She has noted improvement in her left-sided peripheral vision as documented by an optometrist .She has been driving but with her husband at her side and has not had any problems. She has no new complaints today. She returns for reevaluation   REVIEW OF SYSTEMS: Full 14 system review of systems performed and notable only for those listed, all others are neg:  Constitutional: N/A  Cardiovascular: N/A  Ear/Nose/Throat: N/A  Skin: N/A  Eyes: N/A  Respiratory: N/A  Gastroitestinal: N/A  Hematology/Lymphatic: N/A  Endocrine: N/A Musculoskeletal: Joint pains  Allergy/Immunology: N/A  Neurological: Mild memory loss  Psychiatric: Anxiety Sleep : NA   ALLERGIES: No Known Allergies  HOME MEDICATIONS: Outpatient Prescriptions Prior to Visit  Medication Sig Dispense Refill  . aspirin 81 MG tablet Take 81 mg by mouth daily.      Marland Kitchen losartan (COZAAR) 25 MG tablet Take 25 mg by mouth daily.      Marland Kitchen escitalopram (LEXAPRO) 5 MG tablet Take 5 mg by mouth daily.      . traMADol-acetaminophen (ULTRACET) 37.5-325 MG per tablet Take 1 tablet by mouth every 4 (four) hours as needed for pain.  90 tablet  5   No  facility-administered medications prior to visit.    PAST MEDICAL HISTORY: Past Medical History  Diagnosis Date  . HTN (hypertension)   . Stroke     PAST SURGICAL HISTORY: Past Surgical History  Procedure Laterality Date  . Brain surgery      FAMILY HISTORY: Family History  Problem Relation Age of Onset  . Heart attack Father     SOCIAL HISTORY: History   Social History  . Marital Status: Married    Spouse Name: John     Number of Children: 0  . Years of Education: 12   Occupational History  . Clerical   . Retired     Social History Main Topics  . Smoking status: Never Smoker   . Smokeless tobacco: Never Used  . Alcohol Use: No  . Drug Use: No  . Sexual Activity: Not on file   Other Topics Concern  . Not on file   Social History Narrative   Patient lives at home with husband Jenny Reichmann.    Patient has no children.    Patient is retired.    Patient has a high school education.    Patient is right handed.      PHYSICAL EXAM  Filed Vitals:   03/12/14 1338  BP: 157/78  Pulse: 77  Height: 5' 5.5" (1.664 m)  Weight: 154 lb (69.854 kg)   Body mass index is 25.23 kg/(m^2). General: well developed, well nourished, seated, in no evident distress  Head: head normocephalic and atraumatic. Oropharynx benign  Neck: supple with no carotid  bruits  Cardiovascular: regular rate and rhythm, no murmurs  Neurologic Exam  Mental Status: Awake and fully alert. Oriented to place and time. Follows all commands. Speech and language normal.  Cranial Nerves: Pupils equal, briskly reactive to light. Extraocular movements full without nystagmus. Visual fields show mild field cut on the left to confrontation. Hearing intact and symmetric to finger snap. Facial sensation intact. Face, tongue, palate move normally and symmetrically. Neck flexion and extension normal.  Motor: Normal bulk and tone. Normal strength in all tested extremity muscles.No focal weakness. No drift  Sensory.:  intact to touch and pinprick and vibratory.  Coordination: Rapid alternating movements normal in all extremities. Finger-to-nose and heel-to-shin performed accurately bilaterally. No dysmetria  Gait and Station: Arises from chair without difficulty. Stance is normal. Gait demonstrates normal stride length and balance . Able to heel, toe and mildly unsteady with tandem walk.  Reflexes: 2+ and symmetric. Toes downgoing.    DIAGNOSTIC DATA (LABS, IMAGING, TESTING) -    ASSESSMENT AND PLAN  80 y.o. year old female  has a past medical history of right temporal parietal parenchymal brain hemorrhage August 2011 status post craniotomy for hematoma evacuation with excellent clinical recovery without residual deficits except for mild left field cut. Etiology hypertensive.   Continue aspirin 81 daily Continue to keep blood pressure log Followup yearly and when necessary next visit with Dr. Lenis Dickinson Cecille Rubin, [facility], [facility], [facility] [address] [place], [place] [address] [phone]

## 2014-03-12 NOTE — Patient Instructions (Signed)
Continue aspirin 81 daily Continue to keep blood pressure along Followup yearly and when necessary next visit with Dr. Leonie Man

## 2014-03-15 NOTE — Progress Notes (Signed)
I agree with the above plan 

## 2014-04-28 DIAGNOSIS — R21 Rash and other nonspecific skin eruption: Secondary | ICD-10-CM | POA: Diagnosis not present

## 2014-04-28 DIAGNOSIS — I10 Essential (primary) hypertension: Secondary | ICD-10-CM | POA: Diagnosis not present

## 2014-06-10 DIAGNOSIS — Z23 Encounter for immunization: Secondary | ICD-10-CM | POA: Diagnosis not present

## 2014-06-29 DIAGNOSIS — J309 Allergic rhinitis, unspecified: Secondary | ICD-10-CM | POA: Diagnosis not present

## 2014-06-29 DIAGNOSIS — H81313 Aural vertigo, bilateral: Secondary | ICD-10-CM | POA: Diagnosis not present

## 2014-06-29 DIAGNOSIS — Z9181 History of falling: Secondary | ICD-10-CM | POA: Diagnosis not present

## 2014-07-22 ENCOUNTER — Encounter: Payer: Self-pay | Admitting: Neurology

## 2014-07-28 ENCOUNTER — Encounter: Payer: Self-pay | Admitting: Neurology

## 2014-12-12 DIAGNOSIS — R42 Dizziness and giddiness: Secondary | ICD-10-CM | POA: Diagnosis not present

## 2014-12-12 DIAGNOSIS — H81313 Aural vertigo, bilateral: Secondary | ICD-10-CM | POA: Diagnosis not present

## 2014-12-12 DIAGNOSIS — R110 Nausea: Secondary | ICD-10-CM | POA: Diagnosis not present

## 2014-12-12 DIAGNOSIS — Z6824 Body mass index (BMI) 24.0-24.9, adult: Secondary | ICD-10-CM | POA: Diagnosis not present

## 2015-03-09 ENCOUNTER — Emergency Department (HOSPITAL_COMMUNITY)
Admission: EM | Admit: 2015-03-09 | Discharge: 2015-03-09 | Disposition: A | Discharge status: Home / Self Care | Payer: Medicare Other | Attending: Emergency Medicine | Admitting: Emergency Medicine

## 2015-03-09 ENCOUNTER — Encounter (HOSPITAL_COMMUNITY): Payer: Self-pay | Admitting: Emergency Medicine

## 2015-03-09 ENCOUNTER — Emergency Department (HOSPITAL_COMMUNITY): Payer: Medicare Other

## 2015-03-09 DIAGNOSIS — Z79899 Other long term (current) drug therapy: Secondary | ICD-10-CM | POA: Diagnosis not present

## 2015-03-09 DIAGNOSIS — M47816 Spondylosis without myelopathy or radiculopathy, lumbar region: Secondary | ICD-10-CM | POA: Diagnosis not present

## 2015-03-09 DIAGNOSIS — I10 Essential (primary) hypertension: Secondary | ICD-10-CM | POA: Diagnosis not present

## 2015-03-09 DIAGNOSIS — M545 Low back pain: Secondary | ICD-10-CM | POA: Diagnosis not present

## 2015-03-09 DIAGNOSIS — Z7982 Long term (current) use of aspirin: Secondary | ICD-10-CM | POA: Diagnosis not present

## 2015-03-09 DIAGNOSIS — Z8673 Personal history of transient ischemic attack (TIA), and cerebral infarction without residual deficits: Secondary | ICD-10-CM | POA: Diagnosis not present

## 2015-03-09 DIAGNOSIS — M5440 Lumbago with sciatica, unspecified side: Secondary | ICD-10-CM | POA: Diagnosis not present

## 2015-03-09 MED ORDER — OXYCODONE-ACETAMINOPHEN 5-325 MG PO TABS
1.0000 | ORAL_TABLET | Freq: Once | ORAL | Status: AC
  Administered 2015-03-09: 1 via ORAL
  Filled 2015-03-09: qty 1

## 2015-03-09 MED ORDER — LIDOCAINE 5 % EX PTCH: 1.0000 | MEDICATED_PATCH | CUTANEOUS | Status: DC

## 2015-03-09 MED ORDER — CYCLOBENZAPRINE HCL 5 MG PO TABS: 5.0000 mg | ORAL_TABLET | Freq: Three times a day (TID) | ORAL | Status: DC | PRN

## 2015-03-09 MED ORDER — OXYCODONE-ACETAMINOPHEN 5-325 MG PO TABS: 0.5000 | ORAL_TABLET | ORAL | Status: DC | PRN

## 2015-03-09 NOTE — ED Provider Notes (Signed)
History  This chart was scribed for Orpah Greek, MD by Marlowe Kays, ED Scribe. This patient was seen in room APA04/APA04 and the patient's care was started at 8:42 AM.  No chief complaint on file.  The history is provided by the patient and medical records. No language interpreter was used.   HPI Comments:  Chelsea Pratt is a 81 y.o. female who presents to the Emergency Department complaining of new onset, progressively worsening, nonradiating, aching lower back pain that began 9 days ago after washing her car. Pt reports her back "locks up" preventing her from bending over. Laying down makes the pain worse. Walking and standing help to alleviate the pain. She has not taken anything to treat her pain. Denies fever, chills, dysuria, hematuria, urgency, frequency, abdominal pain, numbness, tingling or weakness of the lower extremities, wounds or bruising.   Past Medical History  Diagnosis Date  . HTN (hypertension)   . Stroke    Past Surgical History  Procedure Laterality Date  . Brain surgery     Family History  Problem Relation Age of Onset  . Heart attack Father    History  Substance Use Topics  . Smoking status: Never Smoker   . Smokeless tobacco: Never Used  . Alcohol Use: No   OB History    No data available     Review of Systems  Constitutional: Negative for fever and chills.  Gastrointestinal: Negative for abdominal pain.  Genitourinary: Negative for dysuria, urgency, frequency and hematuria.  Skin: Negative for color change and wound.  Neurological: Negative for weakness and numbness.  All other systems reviewed and are negative.   Allergies  Review of patient's allergies indicates no known allergies.  Home Medications   Prior to Admission medications   Medication Sig Start Date End Date Taking? Authorizing Provider  ALPRAZolam (XANAX PO) Take by mouth as needed (every 8 hours as needed).    Historical Provider, MD  aspirin 81 MG tablet Take 81  mg by mouth daily.    Historical Provider, MD  losartan (COZAAR) 25 MG tablet Take 25 mg by mouth daily.    Historical Provider, MD   Triage Vitals: BP 173/84 mmHg  Pulse 95  Temp(Src) 98.1 F (36.7 C) (Oral)  Resp 18  Ht 5\' 5"  (1.651 m)  Wt 154 lb (69.854 kg)  BMI 25.63 kg/m2  SpO2 98% Physical Exam  Constitutional: She is oriented to person, place, and time. She appears well-developed and well-nourished. No distress.  HENT:  Head: Normocephalic and atraumatic.  Right Ear: Hearing normal.  Left Ear: Hearing normal.  Nose: Nose normal.  Mouth/Throat: Oropharynx is clear and moist and mucous membranes are normal.  Eyes: Conjunctivae and EOM are normal. Pupils are equal, round, and reactive to light.  Neck: Normal range of motion. Neck supple.  Cardiovascular: Normal rate, regular rhythm, S1 normal, S2 normal and normal heart sounds.  Exam reveals no gallop and no friction rub.   No murmur heard. Pulmonary/Chest: Effort normal and breath sounds normal. No respiratory distress. She exhibits no tenderness.  Abdominal: Soft. Normal appearance and bowel sounds are normal. There is no hepatosplenomegaly. There is no tenderness. There is no rebound, no guarding, no tenderness at McBurney's point and negative Murphy's sign. No hernia.  Musculoskeletal: Normal range of motion.  Tenderness to palpation of lumbar region.  Neurological: She is alert and oriented to person, place, and time. She has normal strength. No cranial nerve deficit or sensory deficit. Coordination normal. GCS  eye subscore is 4. GCS verbal subscore is 5. GCS motor subscore is 6.  Patient has normal strength in both lower extremities bilaterally. There is pain with straight leg raise on the right.  Patient has 2+ patellar reflexes bilaterally. She has normal sensation to light touch and pinprick both lower extremities.  No saddle anesthesia.  Skin: Skin is warm, dry and intact. No rash noted. No cyanosis.  Psychiatric: She  has a normal mood and affect. Her speech is normal and behavior is normal. Thought content normal.    ED Course  Procedures (including critical care time) DIAGNOSTIC STUDIES: Oxygen Saturation is 98% on RA, normal by my interpretation.   COORDINATION OF CARE: 8:46 AM- Will X-Ray L-spine and order pain medication. Pt verbalizes understanding and agrees to plan.  Medications - No data to display  Labs Review Labs Reviewed - No data to display  Imaging Review No results found.   EKG Interpretation None      MDM   Final diagnoses:  None   low back pain  Patient presents to the ER with progressively worsening pain in the lower back. She reports that pain started after she washed her car, but there was no specific injury noted. Since then she has had worsening of the pain which is primarily in the lower back, but does radiate to the legs at times. Patient reports that if she is standing up the pain is minimal, but if she lays down, bends over or tries to get up out of bed, she experiences severe pain.  Neurologic examination is normal. X-ray shows minimal L2 endplate compression, unclear chronicity. Patient will be treated with analgesia and is to follow-up with PCP. If symptoms continue will require outpatient MRI and further evaluation.  I personally performed the services described in this documentation, which was scribed in my presence. The recorded information has been reviewed and is accurate.    Orpah Greek, MD 03/09/15 [phone]

## 2015-03-09 NOTE — Discharge Instructions (Signed)
Back Pain, Adult Low back pain is very common. About 1 in 5 people have back pain.The cause of low back pain is rarely dangerous. The pain often gets better over time.About half of people with a sudden onset of back pain feel better in just 2 weeks. About 8 in 10 people feel better by 6 weeks.  CAUSES Some common causes of back pain include:  Strain of the muscles or ligaments supporting the spine.  Wear and tear (degeneration) of the spinal discs.  Arthritis.  Direct injury to the back. DIAGNOSIS Most of the time, the direct cause of low back pain is not known.However, back pain can be treated effectively even when the exact cause of the pain is unknown.Answering your caregiver's questions about your overall health and symptoms is one of the most accurate ways to make sure the cause of your pain is not dangerous. If your caregiver needs more information, he or she may order lab work or imaging tests (X-rays or MRIs).However, even if imaging tests show changes in your back, this usually does not require surgery. HOME CARE INSTRUCTIONS For many people, back pain returns.Since low back pain is rarely dangerous, it is often a condition that people can learn to manageon their own.   Remain active. It is stressful on the back to sit or stand in one place. Do not sit, drive, or stand in one place for more than 30 minutes at a time. Take short walks on level surfaces as soon as pain allows.Try to increase the length of time you walk each day.  Do not stay in bed.Resting more than 1 or 2 days can delay your recovery.  Do not avoid exercise or work.Your body is made to move.It is not dangerous to be active, even though your back may hurt.Your back will likely heal faster if you return to being active before your pain is gone.  Pay attention to your body when you bend and lift. Many people have less discomfortwhen lifting if they bend their knees, keep the load close to their bodies,and  avoid twisting. Often, the most comfortable positions are those that put less stress on your recovering back.  Find a comfortable position to sleep. Use a firm mattress and lie on your side with your knees slightly bent. If you lie on your back, put a pillow under your knees.  Only take over-the-counter or prescription medicines as directed by your caregiver. Over-the-counter medicines to reduce pain and inflammation are often the most helpful.Your caregiver may prescribe muscle relaxant drugs.These medicines help dull your pain so you can more quickly return to your normal activities and healthy exercise.  Put ice on the injured area.  Put ice in a plastic bag.  Place a towel between your skin and the bag.  Leave the ice on for 15-20 minutes, 03-04 times a day for the first 2 to 3 days. After that, ice and heat may be alternated to reduce pain and spasms.  Ask your caregiver about trying back exercises and gentle massage. This may be of some benefit.  Avoid feeling anxious or stressed.Stress increases muscle tension and can worsen back pain.It is important to recognize when you are anxious or stressed and learn ways to manage it.Exercise is a great option. SEEK MEDICAL CARE IF:  You have pain that is not relieved with rest or medicine.  You have pain that does not improve in 1 week.  You have new symptoms.  You are generally not feeling well. SEEK   IMMEDIATE MEDICAL CARE IF:   You have pain that radiates from your back into your legs.  You develop new bowel or bladder control problems.  You have unusual weakness or numbness in your arms or legs.  You develop nausea or vomiting.  You develop abdominal pain.  You feel faint. Document Released: 08/21/2005 Document Revised: 02/20/2012 Document Reviewed: 12/23/2013 Exit[employer] Patient Information 2015 [employer]. This information is not intended to replace advice given to you by your health care provider. Make sure you  discuss any questions you have with your health care provider.  

## 2015-03-09 NOTE — ED Notes (Signed)
PT c/o lower back pain progressively worsening x9 days after washing her car. PT denies any urinary symptoms. PT states worse pain with bending over and pain that radiates down her legs at times.

## 2015-03-09 NOTE — ED Notes (Signed)
MD at bedside. 

## 2015-03-16 DIAGNOSIS — M545 Low back pain: Secondary | ICD-10-CM | POA: Diagnosis not present

## 2015-03-16 DIAGNOSIS — R000 Tachycardia, unspecified: Secondary | ICD-10-CM | POA: Diagnosis not present

## 2015-03-22 ENCOUNTER — Ambulatory Visit: Payer: Medicare Other | Admitting: Neurology

## 2015-03-23 DIAGNOSIS — E782 Mixed hyperlipidemia: Secondary | ICD-10-CM | POA: Diagnosis not present

## 2015-03-23 DIAGNOSIS — R7301 Impaired fasting glucose: Secondary | ICD-10-CM | POA: Diagnosis not present

## 2015-03-23 DIAGNOSIS — I10 Essential (primary) hypertension: Secondary | ICD-10-CM | POA: Diagnosis not present

## 2015-03-26 DIAGNOSIS — G3184 Mild cognitive impairment, so stated: Secondary | ICD-10-CM | POA: Diagnosis not present

## 2015-03-26 DIAGNOSIS — N952 Postmenopausal atrophic vaginitis: Secondary | ICD-10-CM | POA: Diagnosis not present

## 2015-03-26 DIAGNOSIS — M545 Low back pain: Secondary | ICD-10-CM | POA: Diagnosis not present

## 2015-03-26 DIAGNOSIS — F419 Anxiety disorder, unspecified: Secondary | ICD-10-CM | POA: Diagnosis not present

## 2015-03-26 DIAGNOSIS — R7301 Impaired fasting glucose: Secondary | ICD-10-CM | POA: Diagnosis not present

## 2015-03-26 DIAGNOSIS — I10 Essential (primary) hypertension: Secondary | ICD-10-CM | POA: Diagnosis not present

## 2015-04-19 DIAGNOSIS — H2513 Age-related nuclear cataract, bilateral: Secondary | ICD-10-CM | POA: Diagnosis not present

## 2015-04-23 ENCOUNTER — Encounter: Payer: Self-pay | Admitting: Neurology

## 2015-04-23 ENCOUNTER — Ambulatory Visit (INDEPENDENT_AMBULATORY_CARE_PROVIDER_SITE_OTHER): Payer: Medicare Other | Admitting: Neurology

## 2015-04-23 VITALS — BP 155/81 | HR 72 | Ht 66.0 in | Wt 148.2 lb

## 2015-04-23 DIAGNOSIS — G3184 Mild cognitive impairment, so stated: Secondary | ICD-10-CM

## 2015-04-23 NOTE — Patient Instructions (Signed)
I had a long discussion with the patient and her husband regarding her remote intracerebral hemorrhage and mild cognitive impairment. She seems stable and has done well for the last 5 years without any recurrent neurological issues hence I feel she does not need further routine follow up in this office. She was advised to continue strict control of hypertension and participate in intellectually challenging activities like solving crossword puzzles, sudoku and playing bridge. Greater than 50% of time during this 25 minute visit was spent on counseling and coordination of care. She may return for follow-up in the future only if needed.

## 2015-04-23 NOTE — Progress Notes (Signed)
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 Etiology probably hypertensive.  She returns for followup after last visit 03/12/13.  She continues to do well and has not had any significant headaches and even the burning sensation in her left temple has resolved. She states her short-term memory difficulties continue to be minor and unchanged. Her blood pressure apparently is quite good at home but today in our office is elevated at 140/70. She has noted improvement in her left-sided peripheral vision as documented by an optometrist .She has been driving but with her husband at her side and has not had any problems. She has no new complaints today. She returns for reevaluation Update 04/23/2015 : She returns for follow-up after last visit a year ago. She is accompanied by husband. She states she is doing well and her short-term memory difficulties are stable. She still loses things at home and has poor recall for recent events. She states her blood pressure at home is usually in the 120s though she does have whitecoat hypertension and today blood pressure is slightly elevated at 155/81. On Mini-Mental status testing today she scored 27/30  . She had lab work done on 03/23/2015 which showed total cholesterol 209 and LDL cholesterol 134. She's not had any recurrent stroke or TIA symptoms following her intracerebral hemorrhage in 2011.  REVIEW OF SYSTEMS: Full 14 system review of systems performed and notable only for those listed, all others are neg:   Excessive sweating, eye itching, constipation, frequency of urination, dizziness, weakness, joint and  back pain, muscle cramps, nervousness anxiety and rash.  ALLERGIES: No Known Allergies  HOME MEDICATIONS: Outpatient Prescriptions Prior to Visit  Medication Sig Dispense Refill  . ALPRAZolam (XANAX) 0.25 MG tablet Take 0.125 mg by mouth at bedtime as needed for anxiety or sleep.    Marland Kitchen aspirin 81 MG tablet Take 81 mg by mouth daily.    Marland Kitchen losartan (COZAAR) 25 MG tablet Take 25 mg by mouth daily as needed (when blood pressure is over 140).     . cyclobenzaprine (FLEXERIL) 5 MG tablet Take 1 tablet (5 mg total) by mouth 3 (three) times daily as needed for muscle spasms. (Patient not taking: Reported on 04/23/2015) 30 tablet 0  . lidocaine (LIDODERM) 5 % Place 1 patch onto the skin daily. Remove & Discard patch within 12 hours or as directed by MD (Patient not taking: Reported on 04/23/2015) 30 patch 0  . oxyCODONE-acetaminophen (ROXICET) 5-325 MG per tablet Take 0.5 tablets by mouth every 4 (four) hours as needed for severe pain. (Patient not taking: Reported on 04/23/2015) 30 tablet 0   No facility-administered medications prior to visit.    PAST MEDICAL HISTORY: Past Medical History  Diagnosis Date  . HTN (hypertension)   . Stroke     PAST SURGICAL HISTORY: Past Surgical History  Procedure Laterality Date  . Brain surgery      FAMILY HISTORY: Family History  Problem Relation Age of Onset  . Heart attack Father     SOCIAL HISTORY: Social History   Social History  . Marital Status: Married    Spouse Name: Jenny Reichmann   . Number of Children: 0  .  Years of Education: 12   Occupational History  . Clerical   . Retired     Social History Main Topics  . Smoking status: Never Smoker   . Smokeless tobacco: Never Used  . Alcohol Use: No  . Drug Use: No  . Sexual Activity: Not on file   Other Topics Concern  . Not on file   Social History Narrative   Patient lives at home with husband Jenny Reichmann.    Patient has no children.    Patient is retired.    Patient has a high school  education.    Patient is right handed.      PHYSICAL EXAM  Filed Vitals:   04/23/15 0956  BP: 155/81  Pulse: 72  Height: 5\' 6"  (1.676 m)  Weight: 148 lb 3.2 oz (67.223 kg)   Body mass index is 23.93 kg/(m^2). General: well developed, well nourished, seated, in no evident distress  Head: head normocephalic and atraumatic.   Neck: supple with no carotid bruits  Cardiovascular: regular rate and rhythm, no murmurs  Neurologic Exam  Mental Status: Awake and fully alert. Oriented to place and time. Follows all commands. Speech and language normal. Mini-Mental status exam scored 27/30 with one deficit each in attention calculation, recall and following 3 step commands. Clock drawing is 2/4. Animal naming test 8.  Cranial Nerves: Pupils equal, briskly reactive to light. Extraocular movements full without nystagmus. Visual fields show mild field cut on the left to confrontation. Hearing intact and symmetric to finger snap. Facial sensation intact. Face, tongue, palate move normally and symmetrically. Neck flexion and extension normal.  Motor: Normal bulk and tone. Normal strength in all tested extremity muscles.No focal weakness. No drift  Sensory.: intact to touch and pinprick and vibratory.  Coordination: Rapid alternating movements normal in all extremities. Finger-to-nose and heel-to-shin performed accurately bilaterally. No dysmetria  Gait and Station: Arises from chair without difficulty. Stance is normal. Gait demonstrates normal stride length and balance . Able to heel, toe and mildly unsteady with tandem walk.  Reflexes: 2+ and symmetric. Toes downgoing.    DIAGNOSTIC DATA (LABS, IMAGING, TESTING) -    ASSESSMENT AND PLAN  81 y.o. year old female  has a past medical history of right temporal parietal parenchymal brain hemorrhage August 2011 status post craniotomy for hematoma evacuation with excellent clinical recovery without residual deficits except for mild left field cut.  Etiology hypertensive.   I had a long discussion with the patient and her husband regarding her remote intracerebral hemorrhage and mild cognitive impairment. She seems stable and has done well for the last 5 years without any recurrent neurological issues hence I feel she does not need further routine follow up in this office. She was advised to continue strict control of hypertension and participate in intellectually challenging activities like solving crossword puzzles, sudoku and playing bridge. Greater than 50% of time during this 25 minute visit was spent on counseling and coordination of care. She may return for follow-up in the future only if needed. Antony Contras, MD [facility] [address], [place], [place] [address] [phone]

## 2015-06-10 DIAGNOSIS — Z23 Encounter for immunization: Secondary | ICD-10-CM | POA: Diagnosis not present

## 2015-09-29 DIAGNOSIS — I10 Essential (primary) hypertension: Secondary | ICD-10-CM | POA: Diagnosis not present

## 2015-09-29 DIAGNOSIS — R7301 Impaired fasting glucose: Secondary | ICD-10-CM | POA: Diagnosis not present

## 2015-10-01 DIAGNOSIS — F411 Generalized anxiety disorder: Secondary | ICD-10-CM | POA: Diagnosis not present

## 2015-10-01 DIAGNOSIS — E782 Mixed hyperlipidemia: Secondary | ICD-10-CM | POA: Diagnosis not present

## 2015-10-01 DIAGNOSIS — R531 Weakness: Secondary | ICD-10-CM | POA: Diagnosis not present

## 2015-10-01 DIAGNOSIS — I10 Essential (primary) hypertension: Secondary | ICD-10-CM | POA: Diagnosis not present

## 2015-10-01 DIAGNOSIS — R7301 Impaired fasting glucose: Secondary | ICD-10-CM | POA: Diagnosis not present

## 2016-02-07 DIAGNOSIS — G629 Polyneuropathy, unspecified: Secondary | ICD-10-CM | POA: Diagnosis not present

## 2016-03-13 DIAGNOSIS — I10 Essential (primary) hypertension: Secondary | ICD-10-CM | POA: Diagnosis not present

## 2016-03-13 DIAGNOSIS — R7301 Impaired fasting glucose: Secondary | ICD-10-CM | POA: Diagnosis not present

## 2016-03-13 DIAGNOSIS — E782 Mixed hyperlipidemia: Secondary | ICD-10-CM | POA: Diagnosis not present

## 2016-03-16 DIAGNOSIS — R7301 Impaired fasting glucose: Secondary | ICD-10-CM | POA: Diagnosis not present

## 2016-03-16 DIAGNOSIS — G629 Polyneuropathy, unspecified: Secondary | ICD-10-CM | POA: Diagnosis not present

## 2016-03-16 DIAGNOSIS — I10 Essential (primary) hypertension: Secondary | ICD-10-CM | POA: Diagnosis not present

## 2016-03-16 DIAGNOSIS — R2689 Other abnormalities of gait and mobility: Secondary | ICD-10-CM | POA: Diagnosis not present

## 2016-03-16 DIAGNOSIS — R531 Weakness: Secondary | ICD-10-CM | POA: Diagnosis not present

## 2016-03-16 DIAGNOSIS — F411 Generalized anxiety disorder: Secondary | ICD-10-CM | POA: Diagnosis not present

## 2016-03-16 DIAGNOSIS — E782 Mixed hyperlipidemia: Secondary | ICD-10-CM | POA: Diagnosis not present

## 2016-05-01 ENCOUNTER — Other Ambulatory Visit: Payer: Self-pay

## 2016-06-16 DIAGNOSIS — Z23 Encounter for immunization: Secondary | ICD-10-CM | POA: Diagnosis not present

## 2016-08-23 DIAGNOSIS — S61220A Laceration with foreign body of right index finger without damage to nail, initial encounter: Secondary | ICD-10-CM | POA: Diagnosis not present

## 2016-09-15 DIAGNOSIS — R7301 Impaired fasting glucose: Secondary | ICD-10-CM | POA: Diagnosis not present

## 2016-09-15 DIAGNOSIS — E782 Mixed hyperlipidemia: Secondary | ICD-10-CM | POA: Diagnosis not present

## 2016-09-19 DIAGNOSIS — I10 Essential (primary) hypertension: Secondary | ICD-10-CM | POA: Diagnosis not present

## 2016-09-19 DIAGNOSIS — I8310 Varicose veins of unspecified lower extremity with inflammation: Secondary | ICD-10-CM | POA: Diagnosis not present

## 2016-09-19 DIAGNOSIS — G629 Polyneuropathy, unspecified: Secondary | ICD-10-CM | POA: Diagnosis not present

## 2016-09-19 DIAGNOSIS — E782 Mixed hyperlipidemia: Secondary | ICD-10-CM | POA: Diagnosis not present

## 2016-09-19 DIAGNOSIS — N94819 Vulvodynia, unspecified: Secondary | ICD-10-CM | POA: Diagnosis not present

## 2016-09-19 DIAGNOSIS — F411 Generalized anxiety disorder: Secondary | ICD-10-CM | POA: Diagnosis not present

## 2016-09-19 DIAGNOSIS — Z8679 Personal history of other diseases of the circulatory system: Secondary | ICD-10-CM | POA: Diagnosis not present

## 2016-09-19 DIAGNOSIS — R7301 Impaired fasting glucose: Secondary | ICD-10-CM | POA: Diagnosis not present

## 2016-10-04 ENCOUNTER — Encounter: Payer: Self-pay | Admitting: Internal Medicine

## 2016-11-02 DIAGNOSIS — H43811 Vitreous degeneration, right eye: Secondary | ICD-10-CM | POA: Diagnosis not present

## 2017-03-15 DIAGNOSIS — I10 Essential (primary) hypertension: Secondary | ICD-10-CM | POA: Diagnosis not present

## 2017-03-15 DIAGNOSIS — R7301 Impaired fasting glucose: Secondary | ICD-10-CM | POA: Diagnosis not present

## 2017-03-19 DIAGNOSIS — Z8679 Personal history of other diseases of the circulatory system: Secondary | ICD-10-CM | POA: Diagnosis not present

## 2017-03-19 DIAGNOSIS — I10 Essential (primary) hypertension: Secondary | ICD-10-CM | POA: Diagnosis not present

## 2017-03-19 DIAGNOSIS — N3281 Overactive bladder: Secondary | ICD-10-CM | POA: Diagnosis not present

## 2017-03-19 DIAGNOSIS — G3184 Mild cognitive impairment, so stated: Secondary | ICD-10-CM | POA: Diagnosis not present

## 2017-03-19 DIAGNOSIS — R7301 Impaired fasting glucose: Secondary | ICD-10-CM | POA: Diagnosis not present

## 2017-03-19 DIAGNOSIS — F411 Generalized anxiety disorder: Secondary | ICD-10-CM | POA: Diagnosis not present

## 2017-03-19 DIAGNOSIS — F438 Other reactions to severe stress: Secondary | ICD-10-CM | POA: Diagnosis not present

## 2017-03-19 DIAGNOSIS — E782 Mixed hyperlipidemia: Secondary | ICD-10-CM | POA: Diagnosis not present

## 2017-03-19 DIAGNOSIS — I8310 Varicose veins of unspecified lower extremity with inflammation: Secondary | ICD-10-CM | POA: Diagnosis not present

## 2017-04-16 DIAGNOSIS — N3281 Overactive bladder: Secondary | ICD-10-CM | POA: Diagnosis not present

## 2017-04-16 DIAGNOSIS — I10 Essential (primary) hypertension: Secondary | ICD-10-CM | POA: Diagnosis not present

## 2017-04-16 DIAGNOSIS — G3184 Mild cognitive impairment, so stated: Secondary | ICD-10-CM | POA: Diagnosis not present

## 2017-04-16 DIAGNOSIS — Z6825 Body mass index (BMI) 25.0-25.9, adult: Secondary | ICD-10-CM | POA: Diagnosis not present

## 2017-05-29 DIAGNOSIS — Z23 Encounter for immunization: Secondary | ICD-10-CM | POA: Diagnosis not present

## 2017-09-13 DIAGNOSIS — I10 Essential (primary) hypertension: Secondary | ICD-10-CM | POA: Diagnosis not present

## 2017-09-13 DIAGNOSIS — R7301 Impaired fasting glucose: Secondary | ICD-10-CM | POA: Diagnosis not present

## 2017-09-13 DIAGNOSIS — E782 Mixed hyperlipidemia: Secondary | ICD-10-CM | POA: Diagnosis not present

## 2017-09-17 DIAGNOSIS — Z6825 Body mass index (BMI) 25.0-25.9, adult: Secondary | ICD-10-CM | POA: Diagnosis not present

## 2017-09-17 DIAGNOSIS — G3184 Mild cognitive impairment, so stated: Secondary | ICD-10-CM | POA: Diagnosis not present

## 2017-09-17 DIAGNOSIS — G629 Polyneuropathy, unspecified: Secondary | ICD-10-CM | POA: Diagnosis not present

## 2017-09-17 DIAGNOSIS — E782 Mixed hyperlipidemia: Secondary | ICD-10-CM | POA: Diagnosis not present

## 2017-09-17 DIAGNOSIS — R7301 Impaired fasting glucose: Secondary | ICD-10-CM | POA: Diagnosis not present

## 2017-09-17 DIAGNOSIS — F419 Anxiety disorder, unspecified: Secondary | ICD-10-CM | POA: Diagnosis not present

## 2017-09-17 DIAGNOSIS — R600 Localized edema: Secondary | ICD-10-CM | POA: Diagnosis not present

## 2017-09-17 DIAGNOSIS — I10 Essential (primary) hypertension: Secondary | ICD-10-CM | POA: Diagnosis not present

## 2017-09-17 DIAGNOSIS — Z8679 Personal history of other diseases of the circulatory system: Secondary | ICD-10-CM | POA: Diagnosis not present

## 2017-09-17 DIAGNOSIS — I8310 Varicose veins of unspecified lower extremity with inflammation: Secondary | ICD-10-CM | POA: Diagnosis not present

## 2017-10-31 ENCOUNTER — Ambulatory Visit (INDEPENDENT_AMBULATORY_CARE_PROVIDER_SITE_OTHER): Payer: Medicare Other | Admitting: Orthopaedic Surgery

## 2017-11-28 ENCOUNTER — Ambulatory Visit (INDEPENDENT_AMBULATORY_CARE_PROVIDER_SITE_OTHER): Payer: Medicare Other | Admitting: Orthopedic Surgery

## 2017-11-28 ENCOUNTER — Encounter (INDEPENDENT_AMBULATORY_CARE_PROVIDER_SITE_OTHER): Payer: Self-pay | Admitting: Orthopedic Surgery

## 2017-11-28 ENCOUNTER — Ambulatory Visit (INDEPENDENT_AMBULATORY_CARE_PROVIDER_SITE_OTHER): Payer: Medicare Other

## 2017-11-28 VITALS — BP 147/80 | HR 84 | Resp 18 | Ht 66.0 in | Wt 165.0 lb

## 2017-11-28 DIAGNOSIS — M1711 Unilateral primary osteoarthritis, right knee: Secondary | ICD-10-CM

## 2017-11-28 DIAGNOSIS — M25561 Pain in right knee: Secondary | ICD-10-CM | POA: Diagnosis not present

## 2017-11-28 DIAGNOSIS — G8929 Other chronic pain: Secondary | ICD-10-CM

## 2017-11-28 MED ORDER — METHYLPREDNISOLONE ACETATE 40 MG/ML IJ SUSP
80.0000 mg | INTRAMUSCULAR | Status: AC | PRN
  Administered 2017-11-28: 80 mg

## 2017-11-28 MED ORDER — BUPIVACAINE HCL 0.5 % IJ SOLN
2.0000 mL | INTRAMUSCULAR | Status: AC | PRN
  Administered 2017-11-28: 2 mL via INTRA_ARTICULAR

## 2017-11-28 MED ORDER — DICLOFENAC SODIUM 1 % TD GEL: 2.0000 g | Freq: Four times a day (QID) | TRANSDERMAL | 1 refills | Status: DC

## 2017-11-28 MED ORDER — LIDOCAINE HCL 1 % IJ SOLN
2.0000 mL | INTRAMUSCULAR | Status: AC | PRN
  Administered 2017-11-28: 2 mL

## 2017-11-28 NOTE — Progress Notes (Signed)
Office Visit Note   Patient: Chelsea Pratt           Date of Birth: [birth year]           MRN: [id number] Visit Date: 11/28/2017              Requested by: Celene Squibb, MD [address] [place], [place] [address] PCP: Celene Squibb, MD   Assessment & Plan: Visit Diagnoses:  1. Primary osteoarthritis of right knee   2. Chronic pain of right knee     Plan: #1: Patellofemoral arthritis of the right knee  Follow-Up Instructions: Return if symptoms worsen or fail to improve.   Orders:  Orders Placed This Encounter  Procedures  . Large Joint Inj  . XR KNEE 3 VIEW RIGHT   Meds ordered this encounter  Medications  . diclofenac sodium (VOLTAREN) 1 % GEL    Sig: Apply 2-4 g topically 4 (four) times daily.    Dispense:  2 Tube    Refill:  1    Order Specific Question:   Supervising Provider    Answer:   Garald Balding [[id number]]      Procedures: Large Joint Inj: R knee on 11/28/2017 11:03 AM Indications: pain and diagnostic evaluation Details: 25 G 1.5 in needle, anteromedial approach  Arthrogram: No  Medications: 2 mL lidocaine 1 %; 2 mL bupivacaine 0.5 %; 80 mg methylPREDNISolone acetate 40 MG/ML Procedure, treatment alternatives, risks and benefits explained, specific risks discussed. Consent was given by the patient. Immediately prior to procedure a time out was called to verify the correct patient, procedure, equipment, support staff and site/side marked as required. Patient was prepped and draped in the usual sterile fashion.       Clinical Data: No additional findings.   Subjective: Chief Complaint  Patient presents with  . Right Knee - Pain  . New Patient (Initial Visit)    right knee pain for over a year, getting worse, no injury or injections    HPI  Chelsea Pratt is a very pleasant 84 year old white female who is seen today for evaluation of her right knee.  She has had a several year history of right knee pain which is worsening over this past year.   However last week she had stepped in a hole and jarred her knee which is caused her to have increased pain.  She has increasing pain when she walks in the yard.  Denies any other history of injury or trauma.  The pain is located parapatellar and laterally.  Denies any radicular pain.  Review of Systems  Constitutional: Positive for fatigue. Negative for fever.  HENT: Negative for ear pain.   Eyes: Negative for pain.  Respiratory: Negative for cough and shortness of breath.   Cardiovascular: Positive for leg swelling.  Gastrointestinal: Positive for constipation. Negative for blood in stool and diarrhea.  Genitourinary: Negative for difficulty urinating.  Musculoskeletal: Positive for back pain. Negative for neck pain.  Skin: Negative for rash and wound.  Allergic/Immunologic: Negative for food allergies.  Neurological: Positive for dizziness. Negative for weakness, light-headedness, numbness and headaches.  Hematological: Bruises/bleeds easily.  Psychiatric/Behavioral: Negative for sleep disturbance.     Objective: Vital Signs: BP (!) 147/80 (BP Location: Left Arm, Patient Position: Sitting, Cuff Size: Normal)   Pulse 84   Resp 18   Ht 5\' 6"  (1.676 m)   Wt 165 lb (74.8 kg)   BMI 26.63 kg/m   Physical Exam  Constitutional: She  is oriented to person, place, and time. She appears well-developed and well-nourished.  HENT:  Head: Normocephalic and atraumatic.  Eyes: Pupils are equal, round, and reactive to light. EOM are normal.  Pulmonary/Chest: Effort normal.  Neurological: She is alert and oriented to person, place, and time.  Skin: Skin is warm and dry.  Psychiatric: She has a normal mood and affect. Her behavior is normal. Judgment and thought content normal.    Ortho Exam  Examination today reveals range of motion lacking a few degrees of full extension to 120 degrees of flexion.  She does have patellofemoral crepitance with range of motion.  Positive grind test with trapping  of the patella.  Appears ligamentously stable.  No pain in the hip or groin area with internal and external rotation of the hip.  Negative straight leg raising.  Questionable trace effusion.  Specialty Comments:  No specialty comments available.  Imaging: Xr Knee 3 View Right  Result Date: 11/28/2017 Three-view x-ray of the right knee reveals maintenance of the medial and lateral joint space.  She is essentially bone on bone patellofemoral area.  Peri-Articular spurs are noted on the patellofemoral side.    PMFS History: Patient Active Problem List   Diagnosis Date Noted  . 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 Intracerebral hemorrhage ([place]) 03/12/2013  . Osteoarthritis of both knees 01/15/2013   Past Medical History:  Diagnosis Date  . HTN (hypertension)   . Stroke [facility])     Family History  Problem Relation Age of Onset  . Heart attack Father     Past Surgical History:  Procedure Laterality Date  . BRAIN SURGERY     Social History   Occupational History  . Occupation: Medical sales representative  . Occupation: Retired   Tobacco Use  . Smoking status: Never Smoker  . Smokeless tobacco: Never Used  Substance and Sexual Activity  . Alcohol use: No  . Drug use: No  . Sexual activity: Not on file

## 2017-12-08 DIAGNOSIS — R600 Localized edema: Secondary | ICD-10-CM | POA: Diagnosis not present

## 2017-12-08 DIAGNOSIS — L03116 Cellulitis of left lower limb: Secondary | ICD-10-CM | POA: Diagnosis not present

## 2017-12-12 DIAGNOSIS — Z6825 Body mass index (BMI) 25.0-25.9, adult: Secondary | ICD-10-CM | POA: Diagnosis not present

## 2017-12-12 DIAGNOSIS — G629 Polyneuropathy, unspecified: Secondary | ICD-10-CM | POA: Diagnosis not present

## 2017-12-12 DIAGNOSIS — R7301 Impaired fasting glucose: Secondary | ICD-10-CM | POA: Diagnosis not present

## 2017-12-12 DIAGNOSIS — R531 Weakness: Secondary | ICD-10-CM | POA: Diagnosis not present

## 2017-12-12 DIAGNOSIS — E782 Mixed hyperlipidemia: Secondary | ICD-10-CM | POA: Diagnosis not present

## 2017-12-12 DIAGNOSIS — I10 Essential (primary) hypertension: Secondary | ICD-10-CM | POA: Diagnosis not present

## 2017-12-12 DIAGNOSIS — F419 Anxiety disorder, unspecified: Secondary | ICD-10-CM | POA: Diagnosis not present

## 2017-12-12 DIAGNOSIS — F410 Panic disorder [episodic paroxysmal anxiety] without agoraphobia: Secondary | ICD-10-CM | POA: Diagnosis not present

## 2017-12-12 DIAGNOSIS — Z9181 History of falling: Secondary | ICD-10-CM | POA: Diagnosis not present

## 2017-12-12 DIAGNOSIS — Z8679 Personal history of other diseases of the circulatory system: Secondary | ICD-10-CM | POA: Diagnosis not present

## 2017-12-12 DIAGNOSIS — R2689 Other abnormalities of gait and mobility: Secondary | ICD-10-CM | POA: Diagnosis not present

## 2017-12-12 DIAGNOSIS — L03116 Cellulitis of left lower limb: Secondary | ICD-10-CM | POA: Diagnosis not present

## 2017-12-24 ENCOUNTER — Ambulatory Visit (HOSPITAL_COMMUNITY)
Admission: RE | Admit: 2017-12-24 | Discharge: 2017-12-24 | Disposition: A | Discharge status: Home / Self Care | Payer: Medicare Other | Source: Ambulatory Visit | Attending: Internal Medicine | Admitting: Internal Medicine

## 2017-12-24 ENCOUNTER — Other Ambulatory Visit (HOSPITAL_COMMUNITY): Payer: Self-pay | Admitting: Internal Medicine

## 2017-12-24 DIAGNOSIS — R2242 Localized swelling, mass and lump, left lower limb: Secondary | ICD-10-CM

## 2017-12-24 DIAGNOSIS — I8002 Phlebitis and thrombophlebitis of superficial vessels of left lower extremity: Secondary | ICD-10-CM | POA: Insufficient documentation

## 2017-12-24 DIAGNOSIS — I82409 Acute embolism and thrombosis of unspecified deep veins of unspecified lower extremity: Secondary | ICD-10-CM | POA: Diagnosis not present

## 2017-12-24 DIAGNOSIS — L03116 Cellulitis of left lower limb: Secondary | ICD-10-CM | POA: Diagnosis not present

## 2017-12-24 DIAGNOSIS — M79605 Pain in left leg: Secondary | ICD-10-CM | POA: Diagnosis not present

## 2018-02-27 DIAGNOSIS — H43811 Vitreous degeneration, right eye: Secondary | ICD-10-CM | POA: Diagnosis not present

## 2018-03-04 ENCOUNTER — Inpatient Hospital Stay (HOSPITAL_COMMUNITY)
Admission: EM | Admit: 2018-03-04 | Discharge: 2018-03-07 | DRG: 469 | Disposition: A | Discharge status: Skilled Nursing Facility | Payer: Medicare Other | Attending: Internal Medicine | Admitting: Internal Medicine

## 2018-03-04 ENCOUNTER — Other Ambulatory Visit: Payer: Self-pay

## 2018-03-04 ENCOUNTER — Emergency Department (HOSPITAL_COMMUNITY): Payer: Medicare Other

## 2018-03-04 ENCOUNTER — Encounter (HOSPITAL_COMMUNITY): Payer: Self-pay | Admitting: Emergency Medicine

## 2018-03-04 DIAGNOSIS — S72001S Fracture of unspecified part of neck of right femur, sequela: Secondary | ICD-10-CM | POA: Diagnosis not present

## 2018-03-04 DIAGNOSIS — H53461 Homonymous bilateral field defects, right side: Secondary | ICD-10-CM | POA: Diagnosis present

## 2018-03-04 DIAGNOSIS — I69198 Other sequelae of nontraumatic intracerebral hemorrhage: Secondary | ICD-10-CM

## 2018-03-04 DIAGNOSIS — W010XXA Fall on same level from slipping, tripping and stumbling without subsequent striking against object, initial encounter: Secondary | ICD-10-CM | POA: Diagnosis present

## 2018-03-04 DIAGNOSIS — A419 Sepsis, unspecified organism: Secondary | ICD-10-CM | POA: Diagnosis not present

## 2018-03-04 DIAGNOSIS — F0390 Unspecified dementia without behavioral disturbance: Secondary | ICD-10-CM | POA: Diagnosis present

## 2018-03-04 DIAGNOSIS — Z7982 Long term (current) use of aspirin: Secondary | ICD-10-CM

## 2018-03-04 DIAGNOSIS — R41841 Cognitive communication deficit: Secondary | ICD-10-CM | POA: Diagnosis not present

## 2018-03-04 DIAGNOSIS — S72011D Unspecified intracapsular fracture of right femur, subsequent encounter for closed fracture with routine healing: Secondary | ICD-10-CM | POA: Diagnosis not present

## 2018-03-04 DIAGNOSIS — S299XXA Unspecified injury of thorax, initial encounter: Secondary | ICD-10-CM | POA: Diagnosis not present

## 2018-03-04 DIAGNOSIS — D62 Acute posthemorrhagic anemia: Secondary | ICD-10-CM | POA: Diagnosis not present

## 2018-03-04 DIAGNOSIS — W19XXXA Unspecified fall, initial encounter: Secondary | ICD-10-CM | POA: Diagnosis not present

## 2018-03-04 DIAGNOSIS — S72001A Fracture of unspecified part of neck of right femur, initial encounter for closed fracture: Secondary | ICD-10-CM | POA: Diagnosis not present

## 2018-03-04 DIAGNOSIS — R0902 Hypoxemia: Secondary | ICD-10-CM | POA: Diagnosis present

## 2018-03-04 DIAGNOSIS — E8809 Other disorders of plasma-protein metabolism, not elsewhere classified: Secondary | ICD-10-CM | POA: Diagnosis present

## 2018-03-04 DIAGNOSIS — S72011A Unspecified intracapsular fracture of right femur, initial encounter for closed fracture: Secondary | ICD-10-CM | POA: Diagnosis not present

## 2018-03-04 DIAGNOSIS — I10 Essential (primary) hypertension: Secondary | ICD-10-CM | POA: Diagnosis present

## 2018-03-04 DIAGNOSIS — Z96641 Presence of right artificial hip joint: Secondary | ICD-10-CM | POA: Diagnosis not present

## 2018-03-04 DIAGNOSIS — R52 Pain, unspecified: Secondary | ICD-10-CM | POA: Diagnosis not present

## 2018-03-04 DIAGNOSIS — N390 Urinary tract infection, site not specified: Secondary | ICD-10-CM | POA: Diagnosis not present

## 2018-03-04 DIAGNOSIS — Z471 Aftercare following joint replacement surgery: Secondary | ICD-10-CM | POA: Diagnosis not present

## 2018-03-04 DIAGNOSIS — H53462 Homonymous bilateral field defects, left side: Secondary | ICD-10-CM | POA: Diagnosis present

## 2018-03-04 DIAGNOSIS — D649 Anemia, unspecified: Secondary | ICD-10-CM | POA: Diagnosis not present

## 2018-03-04 DIAGNOSIS — L03113 Cellulitis of right upper limb: Secondary | ICD-10-CM | POA: Diagnosis present

## 2018-03-04 DIAGNOSIS — R509 Fever, unspecified: Secondary | ICD-10-CM | POA: Diagnosis not present

## 2018-03-04 DIAGNOSIS — J9811 Atelectasis: Secondary | ICD-10-CM | POA: Diagnosis not present

## 2018-03-04 DIAGNOSIS — M1990 Unspecified osteoarthritis, unspecified site: Secondary | ICD-10-CM | POA: Diagnosis not present

## 2018-03-04 DIAGNOSIS — M6281 Muscle weakness (generalized): Secondary | ICD-10-CM | POA: Diagnosis not present

## 2018-03-04 DIAGNOSIS — M25551 Pain in right hip: Secondary | ICD-10-CM | POA: Diagnosis not present

## 2018-03-04 DIAGNOSIS — I69398 Other sequelae of cerebral infarction: Secondary | ICD-10-CM | POA: Diagnosis not present

## 2018-03-04 DIAGNOSIS — Z9181 History of falling: Secondary | ICD-10-CM | POA: Diagnosis not present

## 2018-03-04 DIAGNOSIS — F419 Anxiety disorder, unspecified: Secondary | ICD-10-CM | POA: Diagnosis not present

## 2018-03-04 DIAGNOSIS — R262 Difficulty in walking, not elsewhere classified: Secondary | ICD-10-CM | POA: Diagnosis not present

## 2018-03-04 DIAGNOSIS — S72041A Displaced fracture of base of neck of right femur, initial encounter for closed fracture: Secondary | ICD-10-CM | POA: Diagnosis not present

## 2018-03-04 DIAGNOSIS — Z96649 Presence of unspecified artificial hip joint: Secondary | ICD-10-CM

## 2018-03-04 LAB — CBC WITH DIFFERENTIAL/PLATELET
BASOS ABS: 0 10*3/uL (ref 0.0–0.1)
BASOS PCT: 0 %
EOS ABS: 0.1 10*3/uL (ref 0.0–0.7)
Eosinophils Relative: 1 %
HCT: 40.6 % (ref 36.0–46.0)
HEMOGLOBIN: 13.2 g/dL (ref 12.0–15.0)
Lymphocytes Relative: 10 %
Lymphs Abs: 1 10*3/uL (ref 0.7–4.0)
MCH: 29.9 pg (ref 26.0–34.0)
MCHC: 32.5 g/dL (ref 30.0–36.0)
MCV: 91.9 fL (ref 78.0–100.0)
Monocytes Absolute: 0.6 10*3/uL (ref 0.1–1.0)
Monocytes Relative: 6 %
NEUTROS PCT: 83 %
Neutro Abs: 8.4 10*3/uL — ABNORMAL HIGH (ref 1.7–7.7)
Platelets: 223 10*3/uL (ref 150–400)
RBC: 4.42 MIL/uL (ref 3.87–5.11)
RDW: 13 % (ref 11.5–15.5)
WBC: 10.1 10*3/uL (ref 4.0–10.5)

## 2018-03-04 LAB — PROTIME-INR
INR: 0.92
PROTHROMBIN TIME: 12.3 s (ref 11.4–15.2)

## 2018-03-04 LAB — BASIC METABOLIC PANEL
Anion gap: 9 (ref 5–15)
BUN: 14 mg/dL (ref 8–23)
CALCIUM: 9.2 mg/dL (ref 8.9–10.3)
CO2: 27 mmol/L (ref 22–32)
CREATININE: 0.77 mg/dL (ref 0.44–1.00)
Chloride: 103 mmol/L (ref 98–111)
Glucose, Bld: 99 mg/dL (ref 70–99)
Potassium: 3.6 mmol/L (ref 3.5–5.1)
SODIUM: 139 mmol/L (ref 135–145)

## 2018-03-04 LAB — GLUCOSE, CAPILLARY: GLUCOSE-CAPILLARY: 141 mg/dL — AB (ref 70–99)

## 2018-03-04 MED ORDER — HEPARIN SODIUM (PORCINE) 5000 UNIT/ML IJ SOLN
5000.0000 [IU] | Freq: Three times a day (TID) | INTRAMUSCULAR | Status: AC
  Administered 2018-03-04: 5000 [IU] via SUBCUTANEOUS
  Filled 2018-03-04 (×2): qty 1

## 2018-03-04 MED ORDER — CHLORHEXIDINE GLUCONATE 4 % EX LIQD
60.0000 mL | Freq: Once | CUTANEOUS | Status: AC
  Administered 2018-03-05: 4 via TOPICAL
  Filled 2018-03-04: qty 60

## 2018-03-04 MED ORDER — HYDROCODONE-ACETAMINOPHEN 5-325 MG PO TABS
1.0000 | ORAL_TABLET | Freq: Four times a day (QID) | ORAL | Status: DC | PRN
  Administered 2018-03-05 – 2018-03-06 (×4): 2 via ORAL
  Administered 2018-03-06 – 2018-03-07 (×2): 1 via ORAL
  Filled 2018-03-04 (×3): qty 1
  Filled 2018-03-04: qty 2
  Filled 2018-03-04: qty 1
  Filled 2018-03-04 (×2): qty 2

## 2018-03-04 MED ORDER — MORPHINE SULFATE (PF) 2 MG/ML IV SOLN
0.5000 mg | INTRAVENOUS | Status: DC | PRN
  Administered 2018-03-04 – 2018-03-06 (×5): 0.5 mg via INTRAVENOUS
  Filled 2018-03-04 (×5): qty 1

## 2018-03-04 MED ORDER — POVIDONE-IODINE 10 % EX SWAB
2.0000 "application " | Freq: Once | CUTANEOUS | Status: AC
  Administered 2018-03-04: 2 via TOPICAL

## 2018-03-04 MED ORDER — CEFAZOLIN SODIUM-DEXTROSE 2-4 GM/100ML-% IV SOLN
2.0000 g | INTRAVENOUS | Status: AC
  Administered 2018-03-05: 2 g via INTRAVENOUS
  Filled 2018-03-04 (×2): qty 100

## 2018-03-04 MED ORDER — METHOCARBAMOL 500 MG PO TABS
500.0000 mg | ORAL_TABLET | Freq: Four times a day (QID) | ORAL | Status: DC | PRN
  Administered 2018-03-05 – 2018-03-07 (×4): 500 mg via ORAL
  Filled 2018-03-04 (×4): qty 1

## 2018-03-04 MED ORDER — SENNOSIDES-DOCUSATE SODIUM 8.6-50 MG PO TABS
1.0000 | ORAL_TABLET | Freq: Every evening | ORAL | Status: DC | PRN
  Filled 2018-03-04: qty 1

## 2018-03-04 MED ORDER — METHOCARBAMOL 1000 MG/10ML IJ SOLN
500.0000 mg | Freq: Four times a day (QID) | INTRAVENOUS | Status: DC | PRN
  Filled 2018-03-04: qty 5

## 2018-03-04 NOTE — ED Provider Notes (Signed)
[facility] EMERGENCY DEPARTMENT Provider Note   CSN: [id number] Arrival date & time: 03/04/18  1011     History   Chief Complaint Chief Complaint  Patient presents with  . Fall    HPI Chelsea Pratt is a 84 y.o. female.  She presents with complaint of acute right hip pain after a fall.  She and her husband were outside in the [place] on the concrete when she turned and possibly her right knee gave way.  She is a history of bad knees.  She landed on her right side and has had persistent pain since then.  There is no loss of consciousness no head pain no neck pain.  She is not on anticoagulation.  There is been no chest pain no shortness of breath no recent illness.  The history is provided by the patient and the spouse.  Hip Pain  This is a new problem. The current episode started less than 1 hour ago. The problem occurs constantly. The problem has not changed since onset.Pertinent negatives include no chest pain, no abdominal pain, no headaches and no shortness of breath. The symptoms are aggravated by bending and twisting. The symptoms are relieved by rest. She has tried rest for the symptoms. The treatment provided mild relief.    Past Medical History:  Diagnosis Date  . HTN (hypertension)   . Stroke [facility])     Patient Active Problem List   Diagnosis Date Noted  . 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 Intracerebral hemorrhage ([place]) 03/12/2013  . Osteoarthritis of both knees 01/15/2013    Past Surgical History:  Procedure Laterality Date  . BRAIN SURGERY       OB History    Gravida      Para      Term      Preterm      AB      Living  0     SAB      TAB      Ectopic      Multiple      Live Births               Home Medications    Prior to Admission medications   Medication Sig Start Date End Date Taking? Authorizing Provider  ALPRAZolam (XANAX) 0.25 MG tablet Take 0.125 mg by mouth at bedtime as needed for anxiety or  sleep.   Yes [provider]  aspirin 81 MG tablet Take 81 mg by mouth daily.   Yes [provider]  losartan (COZAAR) 25 MG tablet Take 25 mg by mouth daily as needed (when blood pressure is over 140).    Yes [provider]  cyclobenzaprine (FLEXERIL) 5 MG tablet Take 1 tablet (5 mg total) by mouth 3 (three) times daily as needed for muscle spasms. Patient not taking: Reported on 04/23/2015 03/09/15   Orpah Greek, MD  diclofenac sodium (VOLTAREN) 1 % GEL Apply 2-4 g topically 4 (four) times daily. 11/28/17   Cherylann Ratel, PA-C    Family History Family History  Problem Relation Age of Onset  . Heart attack Father     Social History Social History   Tobacco Use  . Smoking status: Never Smoker  . Smokeless tobacco: Never Used  Substance Use Topics  . Alcohol use: No  . Drug use: No     Allergies   Patient has no known allergies.   Review of Systems Review of Systems  Constitutional: Negative for fever.  HENT: Negative for sore throat.   Eyes: Negative for visual disturbance.  Respiratory: Negative for shortness of breath.   Cardiovascular: Negative for chest pain.  Gastrointestinal: Negative for abdominal pain.  Genitourinary: Negative for dysuria.  Musculoskeletal: Negative for back pain and neck pain.  Skin: Negative for rash.  Neurological: Negative for syncope and headaches.     Physical Exam Updated Vital Signs BP (!) 159/73 (BP Location: Right Arm)   Pulse 75   Temp 97.9 F (36.6 C) (Oral)   Resp (!) 24   Ht 5\' 6"  (1.676 m)   SpO2 94%   BMI 26.63 kg/m   Physical Exam  Constitutional: She appears well-developed and well-nourished. No distress.  HENT:  Head: Normocephalic and atraumatic.  Right Ear: External ear normal.  Left Ear: External ear normal.  Nose: Nose normal.  Mouth/Throat: Oropharynx is clear and moist.  Eyes: Pupils are equal, round, and reactive to light. Conjunctivae and EOM are normal.  Neck:  Neck supple.  Cardiovascular: Normal rate, regular rhythm, normal heart sounds and intact distal pulses.  No murmur heard. Pulmonary/Chest: Effort normal and breath sounds normal. No respiratory distress.  Abdominal: Soft. There is no tenderness.  Musculoskeletal: She exhibits tenderness. She exhibits no edema.  She is tenderness at her right groin.  There is also some shortening on that side with some external rotation.  She is nontender to her knee and ankle and is got intact distal pulses and sensation.  All 3 other extremities are full range of motion without any deformity or tenderness.  Neurological: She is alert. She has normal strength. No sensory deficit. GCS eye subscore is 4. GCS verbal subscore is 5. GCS motor subscore is 6.  Skin: Skin is warm and dry.  Psychiatric: She has a normal mood and affect.  Nursing note and vitals reviewed.    ED Treatments / Results  Labs (all labs ordered are listed, but only abnormal results are displayed) Labs Reviewed  CBC WITH DIFFERENTIAL/PLATELET - Abnormal; Notable for the following components:      Result Value   Neutro Abs 8.4 (*)    All other components within normal limits  CBC - Abnormal; Notable for the following components:   WBC 10.7 (*)    All other components within normal limits  BASIC METABOLIC PANEL - Abnormal; Notable for the following components:   Glucose, Bld 135 (*)    Calcium 8.6 (*)    All other components within normal limits  GLUCOSE, CAPILLARY - Abnormal; Notable for the following components:   Glucose-Capillary 141 (*)    All other components within normal limits  GLUCOSE, CAPILLARY - Abnormal; Notable for the following components:   Glucose-Capillary 136 (*)    All other components within normal limits  GLUCOSE, CAPILLARY - Abnormal; Notable for the following components:   Glucose-Capillary 113 (*)    All other components within normal limits  SURGICAL PCR SCREEN  BASIC METABOLIC PANEL  PROTIME-INR    TYPE AND SCREEN  PREPARE RBC (CROSSMATCH)  ABO/RH    EKG EKG Interpretation  Date/Time:  Monday March 04 2018 10:16:37 EDT Ventricular Rate:  76 PR Interval:    QRS Duration: 90 QT Interval:  390 QTC Calculation: 439 R Axis:   21 Text Interpretation:  Sinus rhythm no acute st/ts Confirmed by Aletta Edouard [phone]) on 03/04/2018 10:31:08 AM   Radiology Dg Chest 1 View  Result Date: 03/04/2018 CLINICAL DATA:  Trip and fall with known right  hip fracture EXAM: CHEST  1 VIEW COMPARISON:  04/09/2010 FINDINGS: Cardiac shadow is within normal limits. Lungs are well aerated bilaterally. No focal infiltrate or effusion is seen. No bony abnormality is noted. IMPRESSION: No acute abnormality noted. Electronically Signed   By: Inez Catalina M.D.   On: 03/04/2018 12:53   Dg Hip Unilat W Or Wo Pelvis 2-3 Views Right  Result Date: 03/04/2018 CLINICAL DATA:  Recent trip and fall with right hip pain, initial encounter EXAM: DG HIP (WITH OR WITHOUT PELVIS) 3V RIGHT COMPARISON:  None. FINDINGS: Pelvic ring is intact. Subcapital femoral neck fracture is noted with impaction and angulation at the fracture site. No other fractures are seen. No soft tissue changes are noted. IMPRESSION: Subcapital right femoral neck fracture. Electronically Signed   By: Inez Catalina M.D.   On: 03/04/2018 12:48    Procedures Procedures (including critical care time)  Medications Ordered in ED Medications - No data to display   Initial Impression / Assessment and Plan / ED Course  I have reviewed the triage vital signs and the nursing notes.  Pertinent labs & imaging results that were available during my care of the patient were reviewed by me and considered in my medical decision making (see chart for details).  Clinical Course as of Mar 05 1021  Mon Mar 04, 9354  5069 84 year old female with what sounds like a mechanical fall and pain in her right hip with some signs of likely hip fracture with shortening and  rotation.  She is getting some x-rays screening labs EKG.  She likely has a hip fracture and will need an orthopedic consult.   [MB]  8416 Discussed with Dr. Aline Brochure from orthopedics who will be involved to fix the fracture when she clears medical.  Patient and family updated hospitalist paged for admission.   [MB]  6063 Discussed with Dr. Jerilee Hoh from the hospitalist group who will evaluate for admission.   [MB]    Clinical Course User Index [MB] Hayden Rasmussen, MD     Final Clinical Impressions(s) / ED Diagnoses   Final diagnoses:  Closed fracture of right hip, initial encounter [facility])  Fall, initial encounter    ED Discharge Orders    None       Hayden Rasmussen, MD 03/05/18 1024

## 2018-03-04 NOTE — H&P (Signed)
History and Physical    Chelsea Pratt CLE:[id number] DOB: October 28, 1929 DOA: 03/04/2018  Referring MD/NP/PA:Michael Melina Pratt, EDP PCP: Chelsea Squibb, MD  Patient coming from: home  Chief Complaint: fall with right hip pain  HPI: Chelsea Pratt is a 84 y.o. female without much PMH other than OA of both knees and a h/o ICH that has left her with some visual fields defects. At time of my exam, she had just received some pain medication and was quite sedated. History is provided by husband and sister at bedside. Husband states they were outside today talking with some neighbors, when the neighbor's dog got in between patient's feet causing a mechanical fall. She immediately c/o right hip pain and was brought to the hospital for evaluation. A hip xray confirms a subcapital right femoral neck fracture. CXR is negative, labs are all WNL. Dr. Aline Brochure has been consulted for hip repair and we are asked to admit her for further evaluation and management.  Past Medical/Surgical History: Past Medical History:  Diagnosis Date  . HTN (hypertension)   . Stroke [facility])     Past Surgical History:  Procedure Laterality Date  . BRAIN SURGERY      Social History:  reports that she has never smoked. She has never used smokeless tobacco. She reports that she does not drink alcohol or use drugs.  Allergies: No Known Allergies  Family History:  Family History  Problem Relation Age of Onset  . Heart attack Father     Prior to Admission medications   Medication Sig Start Date End Date Taking? Authorizing Provider  ALPRAZolam (XANAX) 0.25 MG tablet Take 0.125 mg by mouth at bedtime as needed for anxiety or sleep.   Yes [provider]  aspirin 81 MG tablet Take 81 mg by mouth daily.   Yes [provider]  losartan (COZAAR) 25 MG tablet Take 25 mg by mouth daily as needed (when blood pressure is over 140).    Yes [provider]  cyclobenzaprine (FLEXERIL) 5 MG tablet Take 1 tablet (5 mg  total) by mouth 3 (three) times daily as needed for muscle spasms. Patient not taking: Reported on 04/23/2015 03/09/15   Orpah Greek, MD  diclofenac sodium (VOLTAREN) 1 % GEL Apply 2-4 g topically 4 (four) times daily. 11/28/17   Cherylann Ratel, PA-C    Review of Systems: Unable to obtain as patient is sedated after receiving pain medications.  Physical Exam: Vitals:   03/04/18 1730 03/04/18 1800 03/04/18 1815 03/04/18 1933  BP: (!) 139/53 (!) 142/58  (!) 148/63  Pulse:    81  Resp: (!) 23 (!) 22 (!) 21 18  Temp:    99.3 F (37.4 C)  TempSrc:    Oral  SpO2:    92%  Height:         Constitutional: drowsy, opens eyes briefly to voice, but falls quickly back to sleep. Eyes: PERRL, lids and conjunctivae normal ENMT: Mucous membranes are moist. Posterior pharynx clear of any exudate or lesions.Normal dentition.  Neck: normal, supple, no masses, no thyromegaly Respiratory: clear to auscultation bilaterally, no wheezing, no crackles. Normal respiratory effort. No accessory muscle use.  Cardiovascular: Regular rate and rhythm, no murmurs / rubs / gallops. No extremity edema. 2+ pedal pulses. No carotid bruits.  Abdomen: no tenderness, no masses palpated. No hepatosplenomegaly. Bowel sounds positive.  Musculoskeletal: no clubbing / cyanosis. Right leg is shortened and externally rotated. Skin: no rashes, lesions, ulcers. No induration Neurologic: Unable  to assess as patient is drowsy  Psychiatric: Unable to assess given current mental state.   Labs on Admission: I have personally reviewed the following labs and imaging studies  CBC: Recent Labs  Lab 03/04/18 1158  WBC 10.1  NEUTROABS 8.4*  HGB 13.2  HCT 40.6  MCV 91.9  PLT 332   Basic Metabolic Panel: Recent Labs  Lab 03/04/18 1158  NA 139  K 3.6  CL 103  CO2 27  GLUCOSE 99  BUN 14  CREATININE 0.77  CALCIUM 9.2   GFR: CrCl cannot be calculated (Unknown ideal weight.). Liver Function Tests: No results  for input(s): AST, ALT, ALKPHOS, BILITOT, PROT, ALBUMIN in the last 168 hours. No results for input(s): LIPASE, AMYLASE in the last 168 hours. No results for input(s): AMMONIA in the last 168 hours. Coagulation Profile: Recent Labs  Lab 03/04/18 1158  INR 0.92   Cardiac Enzymes: No results for input(s): CKTOTAL, CKMB, CKMBINDEX, TROPONINI in the last 168 hours. BNP (last 3 results) No results for input(s): PROBNP in the last 8760 hours. HbA1C: No results for input(s): HGBA1C in the last 72 hours. CBG: No results for input(s): GLUCAP in the last 168 hours. Lipid Profile: No results for input(s): CHOL, HDL, LDLCALC, TRIG, CHOLHDL, LDLDIRECT in the last 72 hours. Thyroid Function Tests: No results for input(s): TSH, T4TOTAL, FREET4, T3FREE, THYROIDAB in the last 72 hours. Anemia Panel: No results for input(s): VITAMINB12, FOLATE, FERRITIN, TIBC, IRON, RETICCTPCT in the last 72 hours. Urine analysis: No results found for: COLORURINE, APPEARANCEUR, LABSPEC, PHURINE, GLUCOSEU, HGBUR, BILIRUBINUR, KETONESUR, PROTEINUR, UROBILINOGEN, NITRITE, LEUKOCYTESUR Sepsis Labs: @LABRCNTIP (procalcitonin:4,lacticidven:4) )No results found for this or any previous visit (from the past 240 hour(s)).   Radiological Exams on Admission: Dg Chest 1 View  Result Date: 03/04/2018 CLINICAL DATA:  Trip and fall with known right hip fracture EXAM: CHEST  1 VIEW COMPARISON:  04/09/2010 FINDINGS: Cardiac shadow is within normal limits. Lungs are well aerated bilaterally. No focal infiltrate or effusion is seen. No bony abnormality is noted. IMPRESSION: No acute abnormality noted. Electronically Signed   By: Inez Catalina M.D.   On: 03/04/2018 12:53   Dg Hip Unilat W Or Wo Pelvis 2-3 Views Right  Result Date: 03/04/2018 CLINICAL DATA:  Recent trip and fall with right hip pain, initial encounter EXAM: DG HIP (WITH OR WITHOUT PELVIS) 3V RIGHT COMPARISON:  None. FINDINGS: Pelvic ring is intact. Subcapital femoral neck  fracture is noted with impaction and angulation at the fracture site. No other fractures are seen. No soft tissue changes are noted. IMPRESSION: Subcapital right femoral neck fracture. Electronically Signed   By: Inez Catalina M.D.   On: 03/04/2018 12:48    EKG: Independently reviewed. NSR, no acute ischemic changes.  Assessment/Plan Active Problems:   Closed right hip fracture [facility])    Right Hip Fracture -Dr. Aline Brochure has been consulted for repair. -OK to proceed to surgery without delay. -For now on SQ heparin, ortho to determine DVT prophylaxis post-repair. -Her h/o ICH is in 2011 so believe it is ok to use DVT prophylaxis (this occurred after another fall). -PT after OR, suspect will need SNF for ST-rehab.    DVT prophylaxis: SQ heparin Code Status: Full Code Family Communication: Husband and sister at bedside updated on plan of care and all questions answered. Disposition Plan: Likely SNF Consults called: Ortho Admission status: Admit - It is my clinical opinion that admission to INPATIENT is reasonable and necessary because of the expectation that this patient will  require hospital care that crosses at least 2 midnights to treat this condition based on the medical complexity of the problems presented.  Given the aforementioned information, the predictability of an adverse outcome is felt to be significant.     Time Spent: 85 minutes  Imunique Samad Isaac Bliss MD [facility] Pager [phone]  If 7PM-7AM, please contact night-coverage [email or web address] Password [email or web address]  03/04/2018, 8:27 PM

## 2018-03-04 NOTE — ED Triage Notes (Addendum)
Pt was walking behind her car, tripped and fell on Right hip.  C/o of right hip pain.  Able to move right leg.  Rotation and shortening to right leg

## 2018-03-04 NOTE — ED Notes (Signed)
Pt placed on purewick 

## 2018-03-05 ENCOUNTER — Inpatient Hospital Stay (HOSPITAL_COMMUNITY): Payer: Medicare Other | Admitting: Anesthesiology

## 2018-03-05 ENCOUNTER — Encounter (HOSPITAL_COMMUNITY)
Admission: EM | Disposition: A | Discharge status: Skilled Nursing Facility | Payer: Self-pay | Source: Home / Self Care | Attending: Internal Medicine

## 2018-03-05 ENCOUNTER — Encounter (HOSPITAL_COMMUNITY): Payer: Self-pay | Admitting: Anesthesiology

## 2018-03-05 ENCOUNTER — Inpatient Hospital Stay (HOSPITAL_COMMUNITY): Payer: Medicare Other

## 2018-03-05 HISTORY — PX: HIP ARTHROPLASTY: SHX981

## 2018-03-05 LAB — BASIC METABOLIC PANEL
ANION GAP: 8 (ref 5–15)
BUN: 16 mg/dL (ref 8–23)
CALCIUM: 8.6 mg/dL — AB (ref 8.9–10.3)
CHLORIDE: 101 mmol/L (ref 98–111)
CO2: 26 mmol/L (ref 22–32)
Creatinine, Ser: 0.8 mg/dL (ref 0.44–1.00)
GFR calc non Af Amer: >60 mL/min (ref >60)
Glucose, Bld: 135 mg/dL — ABNORMAL HIGH (ref 70–99)
Potassium: 3.6 mmol/L (ref 3.5–5.1)
SODIUM: 135 mmol/L (ref 135–145)

## 2018-03-05 LAB — CBC
HCT: 38.3 % (ref 36.0–46.0)
Hemoglobin: 12.7 g/dL (ref 12.0–15.0)
MCH: 30 pg (ref 26.0–34.0)
MCHC: 33.2 g/dL (ref 30.0–36.0)
MCV: 90.3 fL (ref 78.0–100.0)
PLATELETS: 222 10*3/uL (ref 150–400)
RBC: 4.24 MIL/uL (ref 3.87–5.11)
RDW: 13.1 % (ref 11.5–15.5)
WBC: 10.7 10*3/uL — AB (ref 4.0–10.5)

## 2018-03-05 LAB — PREPARE RBC (CROSSMATCH)

## 2018-03-05 LAB — ABO/RH: ABO/RH(D): A POS

## 2018-03-05 LAB — SURGICAL PCR SCREEN
MRSA, PCR: NEGATIVE
STAPHYLOCOCCUS AUREUS: NEGATIVE

## 2018-03-05 LAB — GLUCOSE, CAPILLARY
GLUCOSE-CAPILLARY: 113 mg/dL — AB (ref 70–99)
GLUCOSE-CAPILLARY: 114 mg/dL — AB (ref 70–99)
Glucose-Capillary: 136 mg/dL — ABNORMAL HIGH (ref 70–99)

## 2018-03-05 SURGERY — HEMIARTHROPLASTY, HIP, DIRECT ANTERIOR APPROACH, FOR FRACTURE
Anesthesia: General | Site: Hip | Laterality: Right

## 2018-03-05 MED ORDER — ROCURONIUM BROMIDE 100 MG/10ML IV SOLN
INTRAVENOUS | Status: DC | PRN
  Administered 2018-03-05: 30 mg via INTRAVENOUS

## 2018-03-05 MED ORDER — MENTHOL 3 MG MT LOZG: 1.0000 | LOZENGE | OROMUCOSAL | Status: DC | PRN

## 2018-03-05 MED ORDER — PROPOFOL 10 MG/ML IV BOLUS
INTRAVENOUS | Status: DC | PRN
  Administered 2018-03-05 (×2): 50 mg via INTRAVENOUS
  Administered 2018-03-05: 100 mg via INTRAVENOUS

## 2018-03-05 MED ORDER — ONDANSETRON HCL 4 MG/2ML IJ SOLN
INTRAMUSCULAR | Status: AC
  Filled 2018-03-05: qty 2

## 2018-03-05 MED ORDER — METOCLOPRAMIDE HCL 10 MG PO TABS
5.0000 mg | ORAL_TABLET | Freq: Three times a day (TID) | ORAL | Status: DC | PRN
  Filled 2018-03-05: qty 2

## 2018-03-05 MED ORDER — PROPOFOL 10 MG/ML IV BOLUS
INTRAVENOUS | Status: AC
  Filled 2018-03-05: qty 20

## 2018-03-05 MED ORDER — SUCCINYLCHOLINE CHLORIDE 20 MG/ML IJ SOLN
INTRAMUSCULAR | Status: DC | PRN
  Administered 2018-03-05: 100 mg via INTRAVENOUS

## 2018-03-05 MED ORDER — BUPIVACAINE-EPINEPHRINE (PF) 0.5% -1:200000 IJ SOLN
INTRAMUSCULAR | Status: AC
  Filled 2018-03-05: qty 60

## 2018-03-05 MED ORDER — DOCUSATE SODIUM 100 MG PO CAPS
100.0000 mg | ORAL_CAPSULE | Freq: Two times a day (BID) | ORAL | Status: DC
  Administered 2018-03-05 – 2018-03-07 (×4): 100 mg via ORAL
  Filled 2018-03-05 (×4): qty 1

## 2018-03-05 MED ORDER — 0.9 % SODIUM CHLORIDE (POUR BTL) OPTIME
TOPICAL | Status: DC | PRN
  Administered 2018-03-05: 1000 mL

## 2018-03-05 MED ORDER — ONDANSETRON HCL 4 MG/2ML IJ SOLN
INTRAMUSCULAR | Status: DC | PRN
  Administered 2018-03-05: 4 mg via INTRAVENOUS

## 2018-03-05 MED ORDER — SUGAMMADEX SODIUM 200 MG/2ML IV SOLN
INTRAVENOUS | Status: DC | PRN
  Administered 2018-03-05: 143.2 mg via INTRAVENOUS

## 2018-03-05 MED ORDER — MEPERIDINE HCL 50 MG/ML IJ SOLN: 6.2500 mg | INTRAMUSCULAR | Status: DC | PRN

## 2018-03-05 MED ORDER — HYDROMORPHONE HCL 1 MG/ML IJ SOLN: 0.2500 mg | INTRAMUSCULAR | Status: DC | PRN

## 2018-03-05 MED ORDER — EPHEDRINE SULFATE 50 MG/ML IJ SOLN
INTRAMUSCULAR | Status: DC | PRN
  Administered 2018-03-05 (×2): 10 mg via INTRAVENOUS

## 2018-03-05 MED ORDER — SODIUM CHLORIDE 0.9% IV SOLUTION: Freq: Once | INTRAVENOUS | Status: DC

## 2018-03-05 MED ORDER — ONDANSETRON HCL 4 MG/2ML IJ SOLN: 4.0000 mg | Freq: Four times a day (QID) | INTRAMUSCULAR | Status: DC | PRN

## 2018-03-05 MED ORDER — PHENOL 1.4 % MT LIQD: 1.0000 | OROMUCOSAL | Status: DC | PRN

## 2018-03-05 MED ORDER — LACTATED RINGERS IV SOLN: INTRAVENOUS | Status: DC

## 2018-03-05 MED ORDER — ASPIRIN EC 325 MG PO TBEC
325.0000 mg | DELAYED_RELEASE_TABLET | Freq: Every day | ORAL | Status: DC
  Administered 2018-03-06 – 2018-03-07 (×2): 325 mg via ORAL
  Filled 2018-03-05 (×2): qty 1

## 2018-03-05 MED ORDER — LACTATED RINGERS IV SOLN
INTRAVENOUS | Status: DC | PRN
  Administered 2018-03-05 (×2): via INTRAVENOUS

## 2018-03-05 MED ORDER — FENTANYL CITRATE (PF) 100 MCG/2ML IJ SOLN
INTRAMUSCULAR | Status: DC | PRN
  Administered 2018-03-05: 25 ug via INTRAVENOUS
  Administered 2018-03-05 (×3): 50 ug via INTRAVENOUS

## 2018-03-05 MED ORDER — FENTANYL CITRATE (PF) 250 MCG/5ML IJ SOLN
INTRAMUSCULAR | Status: AC
  Filled 2018-03-05: qty 5

## 2018-03-05 MED ORDER — METOCLOPRAMIDE HCL 5 MG/ML IJ SOLN
5.0000 mg | Freq: Three times a day (TID) | INTRAMUSCULAR | Status: DC | PRN
  Administered 2018-03-06: 10 mg via INTRAVENOUS
  Filled 2018-03-05: qty 2

## 2018-03-05 MED ORDER — CEFAZOLIN SODIUM-DEXTROSE 2-4 GM/100ML-% IV SOLN
2.0000 g | Freq: Four times a day (QID) | INTRAVENOUS | Status: AC
  Administered 2018-03-05 (×2): 2 g via INTRAVENOUS
  Filled 2018-03-05 (×2): qty 100

## 2018-03-05 MED ORDER — BUPIVACAINE-EPINEPHRINE (PF) 0.5% -1:200000 IJ SOLN
INTRAMUSCULAR | Status: DC | PRN
  Administered 2018-03-05: 60 mL via PERINEURAL

## 2018-03-05 MED ORDER — ARTIFICIAL TEARS OPHTHALMIC OINT
TOPICAL_OINTMENT | OPHTHALMIC | Status: AC
  Filled 2018-03-05: qty 3.5

## 2018-03-05 MED ORDER — SODIUM CHLORIDE 0.9 % IR SOLN
Status: DC | PRN
  Administered 2018-03-05: 3000 mL

## 2018-03-05 MED ORDER — PROMETHAZINE HCL 25 MG/ML IJ SOLN: 6.2500 mg | INTRAMUSCULAR | Status: DC | PRN

## 2018-03-05 MED ORDER — SODIUM CHLORIDE 0.9 % IV SOLN
INTRAVENOUS | Status: DC
  Administered 2018-03-05: 13:00:00 via INTRAVENOUS

## 2018-03-05 MED ORDER — SUGAMMADEX SODIUM 200 MG/2ML IV SOLN
INTRAVENOUS | Status: AC
Start: 2018-03-05 — End: ?
  Filled 2018-03-05: qty 2

## 2018-03-05 MED ORDER — ONDANSETRON HCL 4 MG PO TABS: 4.0000 mg | ORAL_TABLET | Freq: Four times a day (QID) | ORAL | Status: DC | PRN

## 2018-03-05 SURGICAL SUPPLY — 55 items
BIT DRILL 2.8X128 (BIT) ×2 IMPLANT
BIT DRILL 2.8X128MM (BIT) ×1
BLADE 10 SAFETY STRL DISP (BLADE) ×3 IMPLANT
BLADE HEX COATED 2.75 (ELECTRODE) ×3 IMPLANT
BLADE SAGITTAL 25.0X1.27X90 (BLADE) ×2 IMPLANT
BLADE SAGITTAL 25.0X1.27X90MM (BLADE) ×1
CAPT HIP HEMI 1 ×2 IMPLANT
CHLORAPREP W/TINT 26ML (MISCELLANEOUS) ×3 IMPLANT
CLOTH BEACON ORANGE TIMEOUT ST (SAFETY) ×3 IMPLANT
COVER LIGHT HANDLE STERIS (MISCELLANEOUS) ×6 IMPLANT
COVER PROBE W GEL 5X96 (DRAPES) ×3 IMPLANT
DECANTER SPIKE VIAL GLASS SM (MISCELLANEOUS) ×6 IMPLANT
DRAPE HIP W/POCKET STRL (DRAPE) ×3 IMPLANT
DRAPE U-SHAPE 47X51 STRL (DRAPES) ×3 IMPLANT
DRSG MEPILEX BORDER 4X12 (GAUZE/BANDAGES/DRESSINGS) ×3 IMPLANT
ELECT REM PT RETURN 9FT ADLT (ELECTROSURGICAL) ×3
ELECTRODE REM PT RTRN 9FT ADLT (ELECTROSURGICAL) ×1 IMPLANT
FACESHIELD LNG OPTICON STERILE (SAFETY) ×3 IMPLANT
GLOVE BIOGEL PI IND STRL 7.0 (GLOVE) ×1 IMPLANT
GLOVE BIOGEL PI INDICATOR 7.0 (GLOVE) ×2
GLOVE SKINSENSE NS SZ8.0 LF (GLOVE) ×4
GLOVE SKINSENSE STRL SZ8.0 LF (GLOVE) ×2 IMPLANT
GLOVE SS N UNI LF 8.5 STRL (GLOVE) ×3 IMPLANT
GOWN STRL REUS W/TWL LRG LVL3 (GOWN DISPOSABLE) ×6 IMPLANT
GOWN STRL REUS W/TWL XL LVL3 (GOWN DISPOSABLE) ×3 IMPLANT
HANDPIECE INTERPULSE COAX TIP (DISPOSABLE) ×3
INST SET MAJOR BONE (KITS) ×3 IMPLANT
IV NS IRRIG 3000ML ARTHROMATIC (IV SOLUTION) ×2 IMPLANT
KIT BLADEGUARD II DBL (SET/KITS/TRAYS/PACK) ×3 IMPLANT
KIT TURNOVER KIT A (KITS) ×3 IMPLANT
MANIFOLD NEPTUNE II (INSTRUMENTS) ×3 IMPLANT
MARKER SKIN DUAL TIP RULER LAB (MISCELLANEOUS) ×3 IMPLANT
NDL HYPO 21X1.5 SAFETY (NEEDLE) ×1 IMPLANT
NEEDLE HYPO 21X1.5 SAFETY (NEEDLE) ×3 IMPLANT
NS IRRIG 1000ML POUR BTL (IV SOLUTION) ×3 IMPLANT
PACK TOTAL JOINT (CUSTOM PROCEDURE TRAY) ×3 IMPLANT
PAD ARMBOARD 7.5X6 YLW CONV (MISCELLANEOUS) ×3 IMPLANT
PASSER SUT SWANSON 36MM LOOP (INSTRUMENTS) ×2 IMPLANT
PIN STMN SNGL STERILE 9X3.6MM (PIN) ×6 IMPLANT
SET BASIN LINEN APH (SET/KITS/TRAYS/PACK) ×3 IMPLANT
SET HNDPC FAN SPRY TIP SCT (DISPOSABLE) ×1 IMPLANT
SPONGE LAP 18X18 X RAY DECT (DISPOSABLE) ×2 IMPLANT
STAPLER VISISTAT 35W (STAPLE) ×5 IMPLANT
SUT BRALON NAB BRD #1 30IN (SUTURE) ×6 IMPLANT
SUT ETHIBOND 5 LR DA (SUTURE) ×6 IMPLANT
SUT MNCRL 0 VIOLET CTX 36 (SUTURE) ×1 IMPLANT
SUT MON AB 2-0 CT1 36 (SUTURE) ×3 IMPLANT
SUT MONOCRYL 0 CTX 36 (SUTURE) ×2
SUT VIC AB 1 CT1 27 (SUTURE)
SUT VIC AB 1 CT1 27XBRD ANTBC (SUTURE) IMPLANT
SYR 30ML LL (SYRINGE) ×3 IMPLANT
SYR BULB IRRIGATION 50ML (SYRINGE) ×3 IMPLANT
TRAY FOLEY CATH SILVER 16FR (SET/KITS/TRAYS/PACK) IMPLANT
WATER STERILE IRR 1000ML POUR (IV SOLUTION) ×6 IMPLANT
YANKAUER SUCT 12FT TUBE ARGYLE (SUCTIONS) ×3 IMPLANT

## 2018-03-05 NOTE — Transfer of Care (Signed)
Immediate Anesthesia Transfer of Care Note  Patient: Chelsea Pratt  Procedure(s) Performed: ARTHROPLASTY BIPOLAR HIP (HEMIARTHROPLASTY) (Right Hip)  Patient Location: PACU  Anesthesia Type:General  Level of Consciousness: awake, alert , oriented and patient cooperative  Airway & Oxygen Therapy: Patient Spontanous Breathing and Patient connected to nasal cannula oxygen  Post-op Assessment: Report given to RN and Post -op Vital signs reviewed and stable  Post vital signs: Reviewed and stable  Last Vitals:  Vitals Value Taken Time  BP 143/50 03/05/2018 11:50 AM  Temp    Pulse 80 03/05/2018 11:51 AM  Resp 17 03/05/2018 11:51 AM  SpO2 88 % 03/05/2018 11:51 AM  Vitals shown include unvalidated device data.  Last Pain:  Vitals:   03/05/18 0910  TempSrc: Oral  PainSc: 9          Complications: No apparent anesthesia complications

## 2018-03-05 NOTE — Anesthesia Postprocedure Evaluation (Signed)
Anesthesia Post Note  Patient: Chelsea Pratt  Procedure(s) Performed: ARTHROPLASTY BIPOLAR HIP (HEMIARTHROPLASTY) (Right Hip)  Patient location during evaluation: PACU Anesthesia Type: General Level of consciousness: awake and alert and oriented Pain management: pain level controlled Vital Signs Assessment: post-procedure vital signs reviewed and stable Respiratory status: spontaneous breathing Cardiovascular status: stable Postop Assessment: no apparent nausea or vomiting Anesthetic complications: no     Last Vitals:  Vitals:   03/05/18 1200 03/05/18 1219  BP: (!) 131/45 (!) 144/76  Pulse: 74 79  Resp: (!) 23 17  Temp:    SpO2: 98% 98%    Last Pain:  Vitals:   03/05/18 1219  TempSrc:   PainSc: 0-No pain                 ADAMS, AMY A

## 2018-03-05 NOTE — Anesthesia Procedure Notes (Addendum)
Procedure Name: Intubation Date/Time: 03/05/2018 9:56 AM Performed by: Andree Elk, Amy A, CRNA Pre-anesthesia Checklist: Patient identified, Timeout performed, Emergency Drugs available, Suction available and Patient being monitored Patient Re-evaluated:Patient Re-evaluated prior to induction Oxygen Delivery Method: Circle system utilized Preoxygenation: Pre-oxygenation with 100% oxygen Induction Type: IV induction Ventilation: Mask ventilation without difficulty Laryngoscope Size: Glidescope and 3 (Unable to visualize cords with Miller 3 or Mac 3) Grade View: Grade I Tube type: Oral Tube size: 7.0 mm Number of attempts: 3 Placement Confirmation: ETT inserted through vocal cords under direct vision,  positive ETCO2 and breath sounds checked- equal and bilateral Secured at: 21 cm Tube secured with: Tape Future Recommendations: Recommend- induction with short-acting agent, and alternative techniques readily available Comments: DL x1 with Miller 3 by CRNA, DL x1 with Mac 3 by MDA; unable to visualize cords with either attempt; DL with glidescope 3 by CRNA without difficulty; Patient with easy mask airway; VSS throughout

## 2018-03-05 NOTE — Consult Note (Addendum)
Reason for Consult: Fracture right hip Referring Physician: Dr. Randie Heinz Chelsea Pratt is an 84 y.o. female.  HPI: 84 year old female fell at home injured her right hip.  She was unable to walk.  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  Apparently does her own shopping has some dementia  Date of injury July 1.  Pain: Right hip, severe nonradiating, associated with inability to walk and deformity  Past Medical History:  Diagnosis Date  . HTN (hypertension)   . Stroke [facility])     Past Surgical History:  Procedure Laterality Date  . BRAIN SURGERY      Family History  Problem Relation Age of Onset  . Heart attack Father     Social History:  reports that she has never smoked. She has never used smokeless tobacco. She reports that she does not drink alcohol or use drugs.  Allergies: No Known Allergies  Medications:  Current Facility-Administered Medications:  .  0.9 %  sodium chloride infusion (Manually program via Guardrails IV Fluids), , Intravenous, Once, Carole Civil, MD .  ceFAZolin (ANCEF) IVPB 2g/100 mL premix, 2 g, Intravenous, On Call to OR, Carole Civil, MD .  HYDROcodone-acetaminophen (NORCO/VICODIN) 5-325 MG per tablet 1-2 tablet, 1-2 tablet, Oral, Q6H PRN, Isaac Bliss, Rayford Halsted, MD, 2 tablet at 03/05/18 0209 .  methocarbamol (ROBAXIN) tablet 500 mg, 500 mg, Oral, Q6H PRN, 500 mg at 03/05/18 0208 **OR** methocarbamol (ROBAXIN) 500 mg in dextrose 5 % 50 mL IVPB, 500 mg, Intravenous, Q6H PRN, Isaac Bliss, Rayford Halsted, MD .  morphine 2 MG/ML injection 0.5 mg, 0.5 mg, Intravenous, Q2H PRN, Isaac Bliss, Rayford Halsted, MD, 0.5 mg at 03/05/18 0525 .  senna-docusate (Senokot-S) tablet 1 tablet, 1 tablet, Oral, QHS PRN, Isaac Bliss, Rayford Halsted, MD   Results for orders placed or performed during the hospital encounter of 03/04/18 (from the past 48 hour(s))  Basic metabolic panel     Status: None   Collection Time: 03/04/18 11:58 AM   Result Value Ref Range   Sodium 139 135 - 145 mmol/L   Potassium 3.6 3.5 - 5.1 mmol/L   Chloride 103 98 - 111 mmol/L    Comment: Please note change in reference range.   CO2 27 22 - 32 mmol/L   Glucose, Bld 99 70 - 99 mg/dL    Comment: Please note change in reference range.   BUN 14 8 - 23 mg/dL    Comment: Please note change in reference range.   Creatinine, Ser 0.77 0.44 - 1.00 mg/dL   Calcium 9.2 8.9 - 10.3 mg/dL   GFR calc non Af Amer >60 >60 mL/min   GFR calc Af Amer >60 >60 mL/min    Comment: (NOTE) The eGFR has been calculated using the CKD EPI equation. This calculation has not been validated in all clinical situations. eGFR's persistently <60 mL/min signify possible Chronic Kidney Disease.    Anion gap 9 5 - 15    Comment: Performed at [facility], [address], [place], [place] [address]  CBC with Differential     Status: Abnormal   Collection Time: 03/04/18 11:58 AM  Result Value Ref Range   WBC 10.1 4.0 - 10.5 K/uL   RBC 4.42 3.87 - 5.11 MIL/uL   Hemoglobin 13.2 12.0 - 15.0 g/dL   HCT 40.6 36.0 - 46.0 %   MCV 91.9 78.0 - 100.0 fL   MCH 29.9 26.0 - 34.0 pg   MCHC 32.5 30.0 -  36.0 g/dL   RDW 13.0 11.5 - 15.5 %   Platelets 223 150 - 400 K/uL   Neutrophils Relative % 83 %   Neutro Abs 8.4 (H) 1.7 - 7.7 K/uL   Lymphocytes Relative 10 %   Lymphs Abs 1.0 0.7 - 4.0 K/uL   Monocytes Relative 6 %   Monocytes Absolute 0.6 0.1 - 1.0 K/uL   Eosinophils Relative 1 %   Eosinophils Absolute 0.1 0.0 - 0.7 K/uL   Basophils Relative 0 %   Basophils Absolute 0.0 0.0 - 0.1 K/uL    Comment: Performed at [facility], [address], [place], [place] [address]  Protime-INR     Status: None   Collection Time: 03/04/18 11:58 AM  Result Value Ref Range   Prothrombin Time 12.3 11.4 - 15.2 seconds   INR 0.92     Comment: Performed at [facility], [address] [place], [place] [address]  Glucose, capillary     Status: Abnormal   Collection Time: 03/04/18  9:33 PM  Result  Value Ref Range   Glucose-Capillary 141 (H) 70 - 99 mg/dL  Surgical pcr screen     Status: None   Collection Time: 03/04/18 11:37 PM  Result Value Ref Range   MRSA, PCR NEGATIVE NEGATIVE   Staphylococcus aureus NEGATIVE NEGATIVE    Comment: (NOTE) The Xpert SA Assay (FDA approved for NASAL specimens in patients 48 years of age and older), is one component of a comprehensive surveillance program. It is not intended to diagnose infection nor to guide or monitor treatment. Performed at [facility], [address], [place], [place] [address]   Glucose, capillary     Status: Abnormal   Collection Time: 03/05/18  5:14 AM  Result Value Ref Range   Glucose-Capillary 136 (H) 70 - 99 mg/dL  CBC     Status: Abnormal   Collection Time: 03/05/18  6:00 AM  Result Value Ref Range   WBC 10.7 (H) 4.0 - 10.5 K/uL   RBC 4.24 3.87 - 5.11 MIL/uL   Hemoglobin 12.7 12.0 - 15.0 g/dL   HCT 38.3 36.0 - 46.0 %   MCV 90.3 78.0 - 100.0 fL   MCH 30.0 26.0 - 34.0 pg   MCHC 33.2 30.0 - 36.0 g/dL   RDW 13.1 11.5 - 15.5 %   Platelets 222 150 - 400 K/uL    Comment: Performed at [facility], [address] [place], [place] [address]    Dg Chest 1 View  Result Date: 03/04/2018 CLINICAL DATA:  Trip and fall with known right hip fracture EXAM: CHEST  1 VIEW COMPARISON:  04/09/2010 FINDINGS: Cardiac shadow is within normal limits. Lungs are well aerated bilaterally. No focal infiltrate or effusion is seen. No bony abnormality is noted. IMPRESSION: No acute abnormality noted. Electronically Signed   By: Inez Catalina M.D.   On: 03/04/2018 12:53   Dg Hip Unilat W Or Wo Pelvis 2-3 Views Right  Result Date: 03/04/2018 CLINICAL DATA:  Recent trip and fall with right hip pain, initial encounter EXAM: DG HIP (WITH OR WITHOUT PELVIS) 3V RIGHT COMPARISON:  None. FINDINGS: Pelvic ring is intact. Subcapital femoral neck fracture is noted with impaction and angulation at the fracture site. No other fractures are seen. No soft  tissue changes are noted. IMPRESSION: Subcapital right femoral neck fracture. Electronically Signed   By: Inez Catalina M.D.   On: 03/04/2018 12:48    Review of Systems  Constitutional: Negative.   HENT: Negative.  Eyes: Negative.   Respiratory: Negative.   Cardiovascular: Negative.   Musculoskeletal: Positive for joint pain.  Skin: Negative.   Neurological: Negative.   Endo/Heme/Allergies: Negative.   Psychiatric/Behavioral: Positive for memory loss.   Blood pressure 137/65, pulse 75, temperature 98.9 F (37.2 C), temperature source Oral, resp. rate 17, height _0  (1.676 m), weight 157 lb 13.6 oz (71.6 kg), SpO2 92 %. Physical Exam  Constitutional: She is oriented to person, place, and time. She appears well-developed and well-nourished. No distress.  HENT:  Head: Normocephalic and atraumatic.  Right Ear: External ear normal.  Left Ear: External ear normal.  Nose: Nose normal.  Mouth/Throat: No oropharyngeal exudate.  Eyes: Pupils are equal, round, and reactive to light. Conjunctivae and EOM are normal. Right eye exhibits no discharge. Left eye exhibits no discharge. No scleral icterus.  Neck: Normal range of motion. Neck supple. No JVD present. No tracheal deviation present. No thyromegaly present.  Cardiovascular: Normal rate, normal heart sounds and intact distal pulses.  Respiratory: Effort normal. No respiratory distress. She has no wheezes. She exhibits no tenderness.  GI: Soft. Bowel sounds are normal. She exhibits no distension.  Lymphadenopathy:       Right cervical: No superficial cervical adenopathy present.      Left cervical: No superficial cervical adenopathy present.       Right: No inguinal adenopathy present.       Left: No inguinal adenopathy present.  Neurological: She is alert and oriented to person, place, and time. She has normal reflexes. She displays no atrophy, no tremor and normal reflexes. No cranial nerve deficit. She exhibits normal muscle tone. Gait  abnormal. Coordination normal.  Skin: Skin is warm and dry. No rash noted. She is not diaphoretic. No erythema.  Psychiatric: She has a normal mood and affect. Her behavior is normal.   Right and left upper extremity: Normal alignment, normal range of motion, normal strength and muscle tone, normal stability without subluxation of all joints, skin normal, pulse normal, sensation normal.  Left lower extremity alignment is normal full range of motion normal muscle tone without tremor all joints reduced without subluxation skin normal.  Pulse normal.  Sensation normal.  Right lower extremity abnormal alignment with external rotation and shortening.  Abnormal motion in the hip with patient unable to move hip and passive range of motion causes pain and hip muscle tone normal without tremor.  Knee and ankle reduced without subluxation.  Skin normal.  Pulse normal.  Sensation normal.   Assessment/Plan:  X-ray pelvis and hip my interpretation complete femoral neck fracture displaced   84 year old female ambulates lives at home has some early dementia fractured her right hip complete femoral neck fracture.  I recommend bipolar replacement.  Right hip  The procedure has been fully reviewed with the patient; The risks and benefits of surgery have been discussed and explained and understood. Alternative treatment has also been reviewed, questions were encouraged and answered. The postoperative plan is also been reviewed.   Arther Abbott 03/05/2018, 6:46 AM

## 2018-03-05 NOTE — Brief Op Note (Signed)
03/05/2018  11:43 AM  PATIENT:  Chelsea Pratt  84 y.o. female  PRE-OPERATIVE DIAGNOSIS:  right displaced femoral neck fracture  POST-OPERATIVE DIAGNOSIS:  right displaced femoral neck fracture  PROCEDURE:  Procedure(s): ARTHROPLASTY BIPOLAR HIP (HEMIARTHROPLASTY) (Right)   Implant [place] basic press-fit stem size 5 we used a 28 inner diameter and a 48 outer diameter head with a +5 neck  We used a direct lateral approach  We found a displaced femoral neck fracture complete 100% displacement with a normal acetabulum  SURGEON:  Surgeon(s) and Role:    Carole Civil, MD - Primary  PHYSICIAN ASSISTANT:   ASSISTANTS: Simonne Maffucci  ANESTHESIA:   general  EBL:  750 mL   BLOOD ADMINISTERED:none  DRAINS: none   LOCAL MEDICATIONS USED:  MARCAINE     SPECIMEN:  No Specimen  DISPOSITION OF SPECIMEN:  N/A  COUNTS:  YES  TOURNIQUET:  * No tourniquets in log *  DICTATION: .Dragon Dictation  PLAN OF CARE: Admit to inpatient   PATIENT DISPOSITION:  PACU - hemodynamically stable.   Delay start of Pharmacological VTE agent (>24hrs) due to surgical blood loss or risk of bleeding: no

## 2018-03-05 NOTE — Anesthesia Preprocedure Evaluation (Signed)
Anesthesia Evaluation  Patient identified by MRN, date of birth, ID band Patient awake    Reviewed: Allergy & Precautions, H&P , NPO status , [name] Chart, lab work & pertinent test results, reviewed documented beta blocker date and time   Airway Mallampati: II  TM Distance: >3 FB Neck ROM: full    Dental no notable dental hx. (+) Teeth Intact   Pulmonary neg pulmonary ROS,    Pulmonary exam normal breath sounds clear to auscultation       Cardiovascular Exercise Tolerance: Good hypertension, On Medications negative cardio ROS   Rhythm:regular Rate:Normal     Neuro/Psych CVA, Residual Symptoms negative neurological ROS  negative psych ROS   GI/Hepatic negative GI ROS, Neg liver ROS,   Endo/Other  negative endocrine ROS  Renal/GU negative Renal ROS  negative genitourinary   Musculoskeletal   Abdominal   Peds  Hematology negative hematology ROS (+)   Anesthesia Other Findings H/o ICH s/p neuro decompression, latent short term memory loss, some inappropriate word choices States she "gets dizzy" when walks but resolves with rest  Reproductive/Obstetrics negative OB ROS                             Anesthesia Physical Anesthesia Plan  ASA: IV  Anesthesia Plan: General ETT   Post-op Pain Management:    Induction:   PONV Risk Score and Plan:   Airway Management Planned:   Additional Equipment:   Intra-op Plan:   Post-operative Plan:   Informed Consent: I have reviewed the patients History and Physical, chart, labs and discussed the procedure including the risks, benefits and alternatives for the proposed anesthesia with the patient or authorized representative who has indicated his/her understanding and acceptance.   Dental Advisory Given  Plan Discussed with: CRNA and Anesthesiologist  Anesthesia Plan Comments:         Anesthesia Quick Evaluation

## 2018-03-05 NOTE — Progress Notes (Signed)
PROGRESS NOTE    KEYERA HATTABAUGH  ACZ:[id number] DOB: Feb 25, 1930 DOA: 03/04/2018 PCP: Celene Squibb, MD     Brief Narrative:  84 year old woman admitted from home on 7/1 after mechanical fall resulted in a right hip fracture.  Dr. Aline Brochure was consulted for repair and I was asked to admit her for further evaluation and management.   Assessment & Plan:   Active Problems:   Closed right hip fracture (HCC)   Closed right hip fracture -Scheduled to go to OR later today with Dr. Aline Brochure. -Suspect will need SNF post repair for short-term rehab. -Postop DVT prophylaxis to be determined by surgery. -She has a history of an intracerebral hemorrhage in 2011, I do not believe this will preclude use of anticoagulation for DVT prophylaxis.   DVT prophylaxis:           Subcutaneous heparin Code Status: Full code Family Communication: Patient only Disposition Plan: Suspect will need short-term SNF  Consultants:   Dr. Aline Brochure  Procedures:   Right hip fracture repair scheduled for 7/2  Antimicrobials:  Anti-infectives (From admission, onward)   Start     Dose/Rate Route Frequency Ordered Stop   03/05/18 1600  ceFAZolin (ANCEF) IVPB 2g/100 mL premix     2 g 200 mL/hr over 30 Minutes Intravenous Every 6 hours 03/05/18 1255 03/06/18 0359   03/05/18 0600  ceFAZolin (ANCEF) IVPB 2g/100 mL premix     2 g 200 mL/hr over 30 Minutes Intravenous On call to O.R. 03/04/18 2031 03/05/18 1011       Subjective: Patient seen earlier this a.m. prior to surgery, lying in bed, no complaints, still quite sleepy.  Objective: Vitals:   03/05/18 1200 03/05/18 1219 03/05/18 1230 03/05/18 1252  BP: (!) 131/45 (!) 144/76 140/69 (!) 135/52  Pulse: 74 79 70 79  Resp: (!) 23 17 16 16   Temp:    (!) 97.5 F (36.4 C)  TempSrc:      SpO2: 98% 98% 96% 92%  Weight:      Height:        Intake/Output Summary (Last 24 hours) at 03/05/2018 1352 Last data filed at 03/05/2018 1153 Gross per 24 hour  Intake  1660 ml  Output 2050 ml  Net -390 ml   Filed Weights   03/04/18 1933  Weight: 71.6 kg (157 lb 13.6 oz)    Examination:  General exam: Drowsy but responds easily to voice Respiratory system: Clear to auscultation. Respiratory effort normal. Cardiovascular system:RRR. No murmurs, rubs, gallops. Gastrointestinal system: Abdomen is nondistended, soft and nontender. No organomegaly or masses felt. Normal bowel sounds heard. Central nervous system: Alert and oriented. No focal neurological deficits. Extremities: Positive pulses, no edema, right leg is shortened and externally rotated Skin: No rashes, lesions or ulcers Psychiatry:  Mood & affect appropriate.     Data Reviewed: I have personally reviewed following labs and imaging studies  CBC: Recent Labs  Lab 03/04/18 1158 03/05/18 0600  WBC 10.1 10.7*  NEUTROABS 8.4*  --   HGB 13.2 12.7  HCT 40.6 38.3  MCV 91.9 90.3  PLT 223 109   Basic Metabolic Panel: Recent Labs  Lab 03/04/18 1158 03/05/18 0600  NA 139 135  K 3.6 3.6  CL 103 101  CO2 27 26  GLUCOSE 99 135*  BUN 14 16  CREATININE 0.77 0.80  CALCIUM 9.2 8.6*   GFR: Estimated Creatinine Clearance: 52.1 mL/min (by C-G formula based on SCr of 0.8 mg/dL). Liver Function Tests: No results for  input(s): AST, ALT, ALKPHOS, BILITOT, PROT, ALBUMIN in the last 168 hours. No results for input(s): LIPASE, AMYLASE in the last 168 hours. No results for input(s): AMMONIA in the last 168 hours. Coagulation Profile: Recent Labs  Lab 03/04/18 1158  INR 0.92   Cardiac Enzymes: No results for input(s): CKTOTAL, CKMB, CKMBINDEX, TROPONINI in the last 168 hours. BNP (last 3 results) No results for input(s): PROBNP in the last 8760 hours. HbA1C: No results for input(s): HGBA1C in the last 72 hours. CBG: Recent Labs  Lab 03/04/18 2133 03/05/18 0514 03/05/18 0742  GLUCAP 141* 136* 113*   Lipid Profile: No results for input(s): CHOL, HDL, LDLCALC, TRIG, CHOLHDL,  LDLDIRECT in the last 72 hours. Thyroid Function Tests: No results for input(s): TSH, T4TOTAL, FREET4, T3FREE, THYROIDAB in the last 72 hours. Anemia Panel: No results for input(s): VITAMINB12, FOLATE, FERRITIN, TIBC, IRON, RETICCTPCT in the last 72 hours. Urine analysis: No results found for: COLORURINE, APPEARANCEUR, LABSPEC, PHURINE, GLUCOSEU, HGBUR, BILIRUBINUR, KETONESUR, PROTEINUR, UROBILINOGEN, NITRITE, LEUKOCYTESUR Sepsis Labs: @LABRCNTIP (procalcitonin:4,lacticidven:4)  ) Recent Results (from the past 240 hour(s))  Surgical pcr screen     Status: None   Collection Time: 03/04/18 11:37 PM  Result Value Ref Range Status   MRSA, PCR NEGATIVE NEGATIVE Final   Staphylococcus aureus NEGATIVE NEGATIVE Final    Comment: (NOTE) The Xpert SA Assay (FDA approved for NASAL specimens in patients 83 years of age and older), is one component of a comprehensive surveillance program. It is not intended to diagnose infection nor to guide or monitor treatment. Performed at [facility], [address], [place], [place] [address]          Radiology Studies: Dg Chest 1 View  Result Date: 03/04/2018 CLINICAL DATA:  Trip and fall with known right hip fracture EXAM: CHEST  1 VIEW COMPARISON:  04/09/2010 FINDINGS: Cardiac shadow is within normal limits. Lungs are well aerated bilaterally. No focal infiltrate or effusion is seen. No bony abnormality is noted. IMPRESSION: No acute abnormality noted. Electronically Signed   By: Inez Catalina M.D.   On: 03/04/2018 12:53   Dg Pelvis Portable  Result Date: 03/05/2018 CLINICAL DATA:  Post right hip replacement EXAM: PORTABLE PELVIS 1-2 VIEWS COMPARISON:  None. FINDINGS: A portable view of the pelvis shows right total hip replacement components in good position. No complicating features are seen. The pelvic rami are intact. The left hip joint space is within normal limits for age. The SI joints appear corticated. IMPRESSION: Components of right total hip  replacement are in good position with no complicating features. Electronically Signed   By: Ivar Drape M.D.   On: 03/05/2018 12:27   Dg Hip Unilat W Or Wo Pelvis 2-3 Views Right  Result Date: 03/04/2018 CLINICAL DATA:  Recent trip and fall with right hip pain, initial encounter EXAM: DG HIP (WITH OR WITHOUT PELVIS) 3V RIGHT COMPARISON:  None. FINDINGS: Pelvic ring is intact. Subcapital femoral neck fracture is noted with impaction and angulation at the fracture site. No other fractures are seen. No soft tissue changes are noted. IMPRESSION: Subcapital right femoral neck fracture. Electronically Signed   By: Inez Catalina M.D.   On: 03/04/2018 12:48        Scheduled Meds: . sodium chloride   Intravenous Once  . [START ON 03/06/2018] aspirin EC  325 mg Oral Q breakfast  . docusate sodium  100 mg Oral BID   Continuous Infusions: . sodium chloride 125 mL/hr at 03/05/18 1303  .  ceFAZolin (ANCEF)  IV    . methocarbamol (ROBAXIN)  IV       LOS: 1 day    Time spent: 25 minutes.      Lelon Frohlich, MD [facility] Pager [phone]  If 7PM-7AM, please contact night-coverage [email or web address] Password [email or web address] 03/05/2018, 1:52 PM

## 2018-03-05 NOTE — H&P (View-Only) (Signed)
Reason for Consult: Fracture right hip Referring Physician: Dr. Randie Heinz Chelsea Pratt is an 84 y.o. female.  HPI: 84 year old female fell at home injured her right hip.  She was unable to walk.  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  Apparently does her own shopping has some dementia  Date of injury July 1.  Pain: Right hip, severe nonradiating, associated with inability to walk and deformity  Past Medical History:  Diagnosis Date  . HTN (hypertension)   . Stroke [facility])     Past Surgical History:  Procedure Laterality Date  . BRAIN SURGERY      Family History  Problem Relation Age of Onset  . Heart attack Father     Social History:  reports that she has never smoked. She has never used smokeless tobacco. She reports that she does not drink alcohol or use drugs.  Allergies: No Known Allergies  Medications:  Current Facility-Administered Medications:  .  0.9 %  sodium chloride infusion (Manually program via Guardrails IV Fluids), , Intravenous, Once, Carole Civil, MD .  ceFAZolin (ANCEF) IVPB 2g/100 mL premix, 2 g, Intravenous, On Call to OR, Carole Civil, MD .  HYDROcodone-acetaminophen (NORCO/VICODIN) 5-325 MG per tablet 1-2 tablet, 1-2 tablet, Oral, Q6H PRN, Isaac Bliss, Rayford Halsted, MD, 2 tablet at 03/05/18 0209 .  methocarbamol (ROBAXIN) tablet 500 mg, 500 mg, Oral, Q6H PRN, 500 mg at 03/05/18 0208 **OR** methocarbamol (ROBAXIN) 500 mg in dextrose 5 % 50 mL IVPB, 500 mg, Intravenous, Q6H PRN, Isaac Bliss, Rayford Halsted, MD .  morphine 2 MG/ML injection 0.5 mg, 0.5 mg, Intravenous, Q2H PRN, Isaac Bliss, Rayford Halsted, MD, 0.5 mg at 03/05/18 0525 .  senna-docusate (Senokot-S) tablet 1 tablet, 1 tablet, Oral, QHS PRN, Isaac Bliss, Rayford Halsted, MD   Results for orders placed or performed during the hospital encounter of 03/04/18 (from the past 48 hour(s))  Basic metabolic panel     Status: None   Collection Time: 03/04/18 11:58 AM   Result Value Ref Range   Sodium 139 135 - 145 mmol/L   Potassium 3.6 3.5 - 5.1 mmol/L   Chloride 103 98 - 111 mmol/L    Comment: Please note change in reference range.   CO2 27 22 - 32 mmol/L   Glucose, Bld 99 70 - 99 mg/dL    Comment: Please note change in reference range.   BUN 14 8 - 23 mg/dL    Comment: Please note change in reference range.   Creatinine, Ser 0.77 0.44 - 1.00 mg/dL   Calcium 9.2 8.9 - 10.3 mg/dL   GFR calc non Af Amer >60 >60 mL/min   GFR calc Af Amer >60 >60 mL/min    Comment: (NOTE) The eGFR has been calculated using the CKD EPI equation. This calculation has not been validated in all clinical situations. eGFR's persistently <60 mL/min signify possible Chronic Kidney Disease.    Anion gap 9 5 - 15    Comment: Performed at [facility], [address], [place], [place] [address]  CBC with Differential     Status: Abnormal   Collection Time: 03/04/18 11:58 AM  Result Value Ref Range   WBC 10.1 4.0 - 10.5 K/uL   RBC 4.42 3.87 - 5.11 MIL/uL   Hemoglobin 13.2 12.0 - 15.0 g/dL   HCT 40.6 36.0 - 46.0 %   MCV 91.9 78.0 - 100.0 fL   MCH 29.9 26.0 - 34.0 pg   MCHC 32.5 30.0 -  36.0 g/dL   RDW 13.0 11.5 - 15.5 %   Platelets 223 150 - 400 K/uL   Neutrophils Relative % 83 %   Neutro Abs 8.4 (H) 1.7 - 7.7 K/uL   Lymphocytes Relative 10 %   Lymphs Abs 1.0 0.7 - 4.0 K/uL   Monocytes Relative 6 %   Monocytes Absolute 0.6 0.1 - 1.0 K/uL   Eosinophils Relative 1 %   Eosinophils Absolute 0.1 0.0 - 0.7 K/uL   Basophils Relative 0 %   Basophils Absolute 0.0 0.0 - 0.1 K/uL    Comment: Performed at [facility], [address], [place], [place] [address]  Protime-INR     Status: None   Collection Time: 03/04/18 11:58 AM  Result Value Ref Range   Prothrombin Time 12.3 11.4 - 15.2 seconds   INR 0.92     Comment: Performed at [facility], [address] [place], [place] [address]  Glucose, capillary     Status: Abnormal   Collection Time: 03/04/18  9:33 PM  Result  Value Ref Range   Glucose-Capillary 141 (H) 70 - 99 mg/dL  Surgical pcr screen     Status: None   Collection Time: 03/04/18 11:37 PM  Result Value Ref Range   MRSA, PCR NEGATIVE NEGATIVE   Staphylococcus aureus NEGATIVE NEGATIVE    Comment: (NOTE) The Xpert SA Assay (FDA approved for NASAL specimens in patients 48 years of age and older), is one component of a comprehensive surveillance program. It is not intended to diagnose infection nor to guide or monitor treatment. Performed at [facility], [address], [place], [place] [address]   Glucose, capillary     Status: Abnormal   Collection Time: 03/05/18  5:14 AM  Result Value Ref Range   Glucose-Capillary 136 (H) 70 - 99 mg/dL  CBC     Status: Abnormal   Collection Time: 03/05/18  6:00 AM  Result Value Ref Range   WBC 10.7 (H) 4.0 - 10.5 K/uL   RBC 4.24 3.87 - 5.11 MIL/uL   Hemoglobin 12.7 12.0 - 15.0 g/dL   HCT 38.3 36.0 - 46.0 %   MCV 90.3 78.0 - 100.0 fL   MCH 30.0 26.0 - 34.0 pg   MCHC 33.2 30.0 - 36.0 g/dL   RDW 13.1 11.5 - 15.5 %   Platelets 222 150 - 400 K/uL    Comment: Performed at [facility], [address] [place], [place] [address]    Dg Chest 1 View  Result Date: 03/04/2018 CLINICAL DATA:  Trip and fall with known right hip fracture EXAM: CHEST  1 VIEW COMPARISON:  04/09/2010 FINDINGS: Cardiac shadow is within normal limits. Lungs are well aerated bilaterally. No focal infiltrate or effusion is seen. No bony abnormality is noted. IMPRESSION: No acute abnormality noted. Electronically Signed   By: Inez Catalina M.D.   On: 03/04/2018 12:53   Dg Hip Unilat W Or Wo Pelvis 2-3 Views Right  Result Date: 03/04/2018 CLINICAL DATA:  Recent trip and fall with right hip pain, initial encounter EXAM: DG HIP (WITH OR WITHOUT PELVIS) 3V RIGHT COMPARISON:  None. FINDINGS: Pelvic ring is intact. Subcapital femoral neck fracture is noted with impaction and angulation at the fracture site. No other fractures are seen. No soft  tissue changes are noted. IMPRESSION: Subcapital right femoral neck fracture. Electronically Signed   By: Inez Catalina M.D.   On: 03/04/2018 12:48    Review of Systems  Constitutional: Negative.   HENT: Negative.  Eyes: Negative.   Respiratory: Negative.   Cardiovascular: Negative.   Musculoskeletal: Positive for joint pain.  Skin: Negative.   Neurological: Negative.   Endo/Heme/Allergies: Negative.   Psychiatric/Behavioral: Positive for memory loss.   Blood pressure 137/65, pulse 75, temperature 98.9 F (37.2 C), temperature source Oral, resp. rate 17, height '5\' 6"'$  (1.676 m), weight 157 lb 13.6 oz (71.6 kg), SpO2 92 %. Physical Exam  Constitutional: She is oriented to person, place, and time. She appears well-developed and well-nourished. No distress.  HENT:  Head: Normocephalic and atraumatic.  Right Ear: External ear normal.  Left Ear: External ear normal.  Nose: Nose normal.  Mouth/Throat: No oropharyngeal exudate.  Eyes: Pupils are equal, round, and reactive to light. Conjunctivae and EOM are normal. Right eye exhibits no discharge. Left eye exhibits no discharge. No scleral icterus.  Neck: Normal range of motion. Neck supple. No JVD present. No tracheal deviation present. No thyromegaly present.  Cardiovascular: Normal rate, normal heart sounds and intact distal pulses.  Respiratory: Effort normal. No respiratory distress. She has no wheezes. She exhibits no tenderness.  GI: Soft. Bowel sounds are normal. She exhibits no distension.  Lymphadenopathy:       Right cervical: No superficial cervical adenopathy present.      Left cervical: No superficial cervical adenopathy present.  Neurological: She is alert and oriented to person, place, and time. She has normal reflexes. She displays normal reflexes. No cranial nerve deficit. She exhibits normal muscle tone. Coordination normal.  Skin: Skin is warm and dry. No rash noted. She is not diaphoretic. No erythema.  Psychiatric: She  has a normal mood and affect. Her behavior is normal.   Right and left upper extremity: Normal alignment, normal range of motion, normal strength and muscle tone, normal stability without subluxation of all joints, skin normal, pulse normal, sensation normal.  Left lower extremity alignment is normal full range of motion normal muscle tone without tremor all joints reduced without subluxation skin normal.  Pulse normal.  Sensation normal.  Right lower extremity abnormal alignment with external rotation and shortening.  Abnormal motion in the hip with patient unable to move hip and passive range of motion causes pain and hip muscle tone normal without tremor.  Knee and ankle reduced without subluxation.  Skin normal.  Pulse normal.  Sensation normal.   Assessment/Plan:  X-ray pelvis and hip my interpretation complete femoral neck fracture displaced   84 year old female ambulates lives at home has some early dementia fractured her right hip complete femoral neck fracture.  I recommend bipolar replacement.  Right hip  The procedure has been fully reviewed with the patient; The risks and benefits of surgery have been discussed and explained and understood. Alternative treatment has also been reviewed, questions were encouraged and answered. The postoperative plan is also been reviewed.   Arther Abbott 03/05/2018, 6:46 AM

## 2018-03-05 NOTE — Op Note (Signed)
Operative report  March 05, 2018  Preop diagnosis displaced  right femoral neck fracture  Postop diagnosis same  Procedure hemiarthroplasty right hip-[id number]  Implants [employer] [place] basic press-fit stem size 5 wheezed outer diameter 48 with an inner diameter 28 bipolar head +5 neck length direct lateral approach  Surgeon Dr. Aline Brochure  Assisted by Simonne Maffucci  General anesthesia  Estimated blood loss 750 cc  Operative findings complete displacement right femoral neck fracture normal acetabulum  Surgery was done as follows  The site was confirmed in preop the chart was reviewed and the right hip was marked as the surgical site.  The patient was taken to the surgical suite for general anesthesia.  She was then placed on the regular bed and a Foley catheter was inserted.  She was then placed on her side right side up axillary roll was placed and her leg was prepped and draped sterilely after which we completed the timeout to confirm instruments x-ray site antibiotics etc.  Direct lateral approach was performed.  The incision was made over the greater trochanter extended proximally and distally.  The subcutaneous tissue was divided down to the fascia.  The fascia was split in line with the skin incision.  A bursectomy was performed over the greater trochanter.  Deep retractors were placed.  The anterior half of the abductors including the minimus and medius was subperiosteally dissected from the greater trochanter retracted proximally and held in place with 2 Steinmann pins  A capsulectomy was performed.  The fracture was encountered.  An osteotome was used to remove the femoral head.  The acetabulum was inspected found to be normal.  After thorough irrigation of the acetabulum we proceeded to prepare the proximal femur.  We prepared the proximal femur per manufacturer technique using a starter hole reamer canal finder a trochanteric reamer and then serial broaching up to a size 5.  We then  did a trial with a 5 neck 48 head bipolar trial.  Excellent reduction was obtained and the hip was stable in all positions  The trial components were removed we placed to drill holes in the greater trochanter and passed a #5 Ethibond suture.  The implant was then placed.  The head was placed.  The hip was reduced and the trial reduction was repeated and was stable  The wounds were irrigated the hip was injected with Marcaine with epinephrine the abductors were repaired to the greater trochanter with two #5 Ethibond suture 1 through drill holes and 1 through tendon.  The leg was then abducted the fascial layer was closed with running #1 Surgilon  2 layers of subcutaneous tissue closure was performed with 0 Monocryl and then skin reapproximated with staples  Patient was taken back to the regular bed her leg lengths were equal she was taken to recovery room in stable condition  Postop plan  Weightbearing status as tolerated DVT prevention for 28 days Staples out at 14 days Office visit at 28 days Hip precautions for direct lateral approach

## 2018-03-05 NOTE — Interval H&P Note (Signed)
History and Physical Interval Note:  03/05/2018 9:30 AM  Chelsea Pratt  has presented today for surgery, with the diagnosis of right displaced femoral neck fracture  The various methods of treatment have been discussed with the patient and family. After consideration of risks, benefits and other options for treatment, the patient has consented to  Procedure(s): ARTHROPLASTY BIPOLAR HIP (HEMIARTHROPLASTY) (Right) as a surgical intervention .  The patient's history has been reviewed, patient examined, no change in status, stable for surgery.  I have reviewed the patient's chart and labs.  Questions were answered to the patient's satisfaction.     Chelsea Pratt

## 2018-03-05 NOTE — Progress Notes (Signed)
Patient wiped off with CHG wipes by Nurse Tech in preparation for surgery.

## 2018-03-06 ENCOUNTER — Encounter (HOSPITAL_COMMUNITY): Payer: Self-pay | Admitting: Orthopedic Surgery

## 2018-03-06 LAB — BASIC METABOLIC PANEL
Anion gap: 6 (ref 5–15)
BUN: 13 mg/dL (ref 8–23)
CO2: 26 mmol/L (ref 22–32)
CREATININE: 0.81 mg/dL (ref 0.44–1.00)
Calcium: 7.7 mg/dL — ABNORMAL LOW (ref 8.9–10.3)
Chloride: 102 mmol/L (ref 98–111)
GFR calc Af Amer: >60 mL/min (ref >60)
Glucose, Bld: 139 mg/dL — ABNORMAL HIGH (ref 70–99)
POTASSIUM: 3.7 mmol/L (ref 3.5–5.1)
SODIUM: 134 mmol/L — AB (ref 135–145)

## 2018-03-06 LAB — CBC
HEMATOCRIT: 31 % — AB (ref 36.0–46.0)
Hemoglobin: 10.2 g/dL — ABNORMAL LOW (ref 12.0–15.0)
MCH: 30.3 pg (ref 26.0–34.0)
MCHC: 32.9 g/dL (ref 30.0–36.0)
MCV: 92 fL (ref 78.0–100.0)
Platelets: 168 10*3/uL (ref 150–400)
RBC: 3.37 MIL/uL — AB (ref 3.87–5.11)
RDW: 13.4 % (ref 11.5–15.5)
WBC: 8.2 10*3/uL (ref 4.0–10.5)

## 2018-03-06 NOTE — Anesthesia Postprocedure Evaluation (Signed)
Anesthesia Post Note  Patient: Chelsea Pratt  Procedure(s) Performed: ARTHROPLASTY BIPOLAR HIP (HEMIARTHROPLASTY) (Right Hip)  Patient location during evaluation: PACU Anesthesia Type: General Level of consciousness: awake and confused Pain management: pain level controlled Vital Signs Assessment: post-procedure vital signs reviewed and stable Respiratory status: spontaneous breathing Cardiovascular status: blood pressure returned to baseline Postop Assessment: no apparent nausea or vomiting Anesthetic complications: no     Last Vitals:  Vitals:   03/06/18 0309 03/06/18 0500  BP: 137/64 (!) 132/55  Pulse: 71 77  Resp: 18 17  Temp: 36.9 C 36.7 C  SpO2: 97% 97%    Last Pain:  Vitals:   03/06/18 1141  PainSc: Asleep                 Roshawna Colclasure

## 2018-03-06 NOTE — NC FL2 (Signed)
[place] LEVEL OF CARE SCREENING TOOL     IDENTIFICATION  Patient Name: Chelsea Pratt Birthdate: [birth year] Sex: female Admission Date (Current Location): 03/04/2018  [facility] and [place] Number:  [employer] and Address:  [facility] [address] [place]      Provider Number: [id number]  Attending Physician Name and Address:  Rodena Goldmann, DO  Relative Name and Phone Number:  Yatziri Wainwright (husband) [phone]    Current Level of Care: Hospital Recommended Level of Care: [place] Prior Approval Number:    Date Approved/Denied:   PASRR Number: pending  Discharge Plan: SNF    Current Diagnoses: Patient Active Problem List   Diagnosis Date Noted  . Closed right hip fracture ([place]) 03/04/2018  . 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 Intracerebral hemorrhage ([place]) 03/12/2013  . Osteoarthritis of both knees 01/15/2013    Orientation RESPIRATION BLADDER Height & Weight     Self, Situation, Place  O2(see dc summary) Continent Weight: 157 lb 13.6 oz (71.6 kg) Height:  5\' 6"  (167.6 cm)  BEHAVIORAL SYMPTOMS/MOOD NEUROLOGICAL BOWEL NUTRITION STATUS      Continent Diet(see dc summary)  AMBULATORY STATUS COMMUNICATION OF NEEDS Skin   Extensive Assist Verbally Surgical wounds                       Personal Care Assistance Level of Assistance  Bathing, Feeding, Dressing Bathing Assistance: Limited assistance Feeding assistance: Independent Dressing Assistance: Limited assistance     Functional Limitations Info  Sight, Hearing, Speech Sight Info: Adequate Hearing Info: Adequate Speech Info: Adequate    SPECIAL CARE FACTORS FREQUENCY  PT (By licensed PT)     PT Frequency: 5 times week              Contractures Contractures Info: Not present    Additional Factors Info  Code Status, Allergies, Psychotropic Code Status Info: full Allergies Info: No Known  Allergies Psychotropic Info: Xanax         Current Medications (03/06/2018):  This is the current hospital active medication list Current Facility-Administered Medications  Medication Dose Route Frequency Provider Last Rate Last Dose  . 0.9 %  sodium chloride infusion (Manually program via Guardrails IV Fluids)   Intravenous Once Carole Civil, MD      . 0.9 %  sodium chloride infusion   Intravenous Continuous Carole Civil, MD 125 mL/hr at 03/05/18 1303    . aspirin EC tablet 325 mg  325 mg Oral Q breakfast Carole Civil, MD   325 mg at 03/06/18 0934  . docusate sodium (COLACE) capsule 100 mg  100 mg Oral BID Carole Civil, MD   100 mg at 03/06/18 0934  . HYDROcodone-acetaminophen (NORCO/VICODIN) 5-325 MG per tablet 1-2 tablet  1-2 tablet Oral Q6H PRN Carole Civil, MD   2 tablet at 03/06/18 1041  . menthol-cetylpyridinium (CEPACOL) lozenge 3 mg  1 lozenge Oral PRN Carole Civil, MD       Or  . phenol (CHLORASEPTIC) mouth spray 1 spray  1 spray Mouth/Throat PRN Carole Civil, MD      . methocarbamol (ROBAXIN) tablet 500 mg  500 mg Oral Q6H PRN Carole Civil, MD   500 mg at 03/06/18 0135   Or  . methocarbamol (ROBAXIN) 500 mg in dextrose 5 % 50 mL IVPB  500 mg Intravenous Q6H PRN Carole Civil, MD      .  metoCLOPramide (REGLAN) tablet 5-10 mg  5-10 mg Oral Q8H PRN Carole Civil, MD       Or  . metoCLOPramide (REGLAN) injection 5-10 mg  5-10 mg Intravenous Q8H PRN Carole Civil, MD      . morphine 2 MG/ML injection 0.5 mg  0.5 mg Intravenous Q2H PRN Carole Civil, MD   0.5 mg at 03/05/18 0525  . ondansetron (ZOFRAN) tablet 4 mg  4 mg Oral Q6H PRN Carole Civil, MD       Or  . ondansetron [facility]) injection 4 mg  4 mg Intravenous Q6H PRN Carole Civil, MD      . senna-docusate (Senokot-S) tablet 1 tablet  1 tablet Oral QHS PRN Carole Civil, MD         Discharge Medications: Please see discharge  summary for a list of discharge medications.  Relevant Imaging Results:  Relevant Lab Results:   Additional Information SSN: [id number], [place]

## 2018-03-06 NOTE — Progress Notes (Signed)
PROGRESS NOTE    Chelsea Pratt  UXL:[id number] DOB: September 08, 1929 DOA: 03/04/2018 PCP: Celene Squibb, MD     Brief Narrative:  84 year old woman admitted from home on 7/1 after mechanical fall resulted in a right hip fracture.  She has now undergone bipolar replacement of the right hip and is postoperative day 1 with no acute issues noted overnight.   Assessment & Plan:   Active Problems:   Closed right hip fracture (HCC)   Closed right hip fracture -Status post repair on 7/2 - Working on short-term rehab placement hopefully next 24 to 48 hours - DVT prophylaxis per surgery with full dose aspirin currently on board  Post-operative anemia -Likely secondary to some acute blood loss -No acute bleeding currently noted, monitor with CBC in a.m.  DVT prophylaxis: Full dose ASA Code Status: Full code Family Communication: Family members at bedside Disposition Plan: Placement to short-term rehab pending  Consultants:   Dr. Aline Brochure  Procedures:   Right hip fracture repair on 7/2  Antimicrobials:  Anti-infectives (From admission, onward)   Start     Dose/Rate Route Frequency Ordered Stop   03/05/18 1600  ceFAZolin (ANCEF) IVPB 2g/100 mL premix     2 g 200 mL/hr over 30 Minutes Intravenous Every 6 hours 03/05/18 1255 03/05/18 2129   03/05/18 0600  ceFAZolin (ANCEF) IVPB 2g/100 mL premix     2 g 200 mL/hr over 30 Minutes Intravenous On call to O.R. 03/04/18 2031 03/05/18 1011       Subjective: Patient seen and evaluated this morning.  She is much more comfortable and has much less pain.  No acute overnight events noted.  Objective: Vitals:   03/05/18 1900 03/05/18 2308 03/06/18 0309 03/06/18 0500  BP: (!) 133/56 (!) 104/46 137/64 (!) 132/55  Pulse:  80 71 77  Resp: 17 18 18 17   Temp: 99.8 F (37.7 C) 98.4 F (36.9 C) 98.4 F (36.9 C) 98.1 F (36.7 C)  TempSrc: Oral Oral    SpO2: 98% 99% 97% 97%  Weight:      Height:        Intake/Output Summary (Last 24 hours)  at 03/06/2018 1409 Last data filed at 03/06/2018 1047 Gross per 24 hour  Intake 1580.42 ml  Output 750 ml  Net 830.42 ml   Filed Weights   03/04/18 1933  Weight: 71.6 kg (157 lb 13.6 oz)    Examination:  General exam: Drowsy but responds easily to voice Respiratory system: Clear to auscultation. Respiratory effort normal. Cardiovascular system:RRR. No murmurs, rubs, gallops. Gastrointestinal system: Abdomen is nondistended, soft and nontender. No organomegaly or masses felt. Normal bowel sounds heard. Central nervous system: Alert and oriented. No focal neurological deficits. Extremities: Positive pulses, no edema, right leg is shortened and externally rotated Skin: No rashes, lesions or ulcers; right hip dressing clean dry and intact    Data Reviewed: I have personally reviewed following labs and imaging studies  CBC: Recent Labs  Lab 03/04/18 1158 03/05/18 0600 03/06/18 0607  WBC 10.1 10.7* 8.2  NEUTROABS 8.4*  --   --   HGB 13.2 12.7 10.2*  HCT 40.6 38.3 31.0*  MCV 91.9 90.3 92.0  PLT 223 222 536   Basic Metabolic Panel: Recent Labs  Lab 03/04/18 1158 03/05/18 0600 03/06/18 0607  NA 139 135 134*  K 3.6 3.6 3.7  CL 103 101 102  CO2 27 26 26   GLUCOSE 99 135* 139*  BUN 14 16 13   CREATININE 0.77 0.80 0.81  CALCIUM 9.2 8.6* 7.7*   GFR: Estimated Creatinine Clearance: 51.5 mL/min (by C-G formula based on SCr of 0.81 mg/dL). Liver Function Tests: No results for input(s): AST, ALT, ALKPHOS, BILITOT, PROT, ALBUMIN in the last 168 hours. No results for input(s): LIPASE, AMYLASE in the last 168 hours. No results for input(s): AMMONIA in the last 168 hours. Coagulation Profile: Recent Labs  Lab 03/04/18 1158  INR 0.92   Cardiac Enzymes: No results for input(s): CKTOTAL, CKMB, CKMBINDEX, TROPONINI in the last 168 hours. BNP (last 3 results) No results for input(s): PROBNP in the last 8760 hours. HbA1C: No results for input(s): HGBA1C in the last 72  hours. CBG: Recent Labs  Lab 03/04/18 2133 03/05/18 0514 03/05/18 0742 03/05/18 1631  GLUCAP 141* 136* 113* 114*   Lipid Profile: No results for input(s): CHOL, HDL, LDLCALC, TRIG, CHOLHDL, LDLDIRECT in the last 72 hours. Thyroid Function Tests: No results for input(s): TSH, T4TOTAL, FREET4, T3FREE, THYROIDAB in the last 72 hours. Anemia Panel: No results for input(s): VITAMINB12, FOLATE, FERRITIN, TIBC, IRON, RETICCTPCT in the last 72 hours. Urine analysis: No results found for: COLORURINE, APPEARANCEUR, LABSPEC, PHURINE, GLUCOSEU, HGBUR, BILIRUBINUR, KETONESUR, PROTEINUR, UROBILINOGEN, NITRITE, LEUKOCYTESUR Sepsis Labs: @LABRCNTIP (procalcitonin:4,lacticidven:4)  ) Recent Results (from the past 240 hour(s))  Surgical pcr screen     Status: None   Collection Time: 03/04/18 11:37 PM  Result Value Ref Range Status   MRSA, PCR NEGATIVE NEGATIVE Final   Staphylococcus aureus NEGATIVE NEGATIVE Final    Comment: (NOTE) The Xpert SA Assay (FDA approved for NASAL specimens in patients 44 years of age and older), is one component of a comprehensive surveillance program. It is not intended to diagnose infection nor to guide or monitor treatment. Performed at [facility], [address] [place], [place] [address]          Radiology Studies: Dg Pelvis Portable  Result Date: 03/05/2018 CLINICAL DATA:  Post right hip replacement EXAM: PORTABLE PELVIS 1-2 VIEWS COMPARISON:  None. FINDINGS: A portable view of the pelvis shows right total hip replacement components in good position. No complicating features are seen. The pelvic rami are intact. The left hip joint space is within normal limits for age. The SI joints appear corticated. IMPRESSION: Components of right total hip replacement are in good position with no complicating features. Electronically Signed   By: Ivar Drape M.D.   On: 03/05/2018 12:27        Scheduled Meds: . sodium chloride   Intravenous Once  . aspirin EC   325 mg Oral Q breakfast  . docusate sodium  100 mg Oral BID   Continuous Infusions: . sodium chloride 125 mL/hr at 03/05/18 1303  . methocarbamol (ROBAXIN)  IV       LOS: 2 days    Time spent: 25 minutes.      Yasmin Dibello Darleen Crocker, DO [facility] Pager [phone]  If 7PM-7AM, please contact night-coverage [email or web address] Password [email or web address] 03/06/2018, 2:09 PM

## 2018-03-06 NOTE — Clinical Social Work Note (Signed)
Clinical Social Work Assessment  Patient Details  Name: Chelsea Pratt MRN: [id number] Date of Birth: [birth year]  Date of referral:  03/06/18               Reason for consult:  Facility Placement, Discharge Planning                Permission sought to share information with:  Chartered certified accountant granted to share information::  Yes, Verbal Permission Granted  Name::        Agency::  [facility]  Relationship::     Contact Information:     Housing/Transportation Living arrangements for the past 2 months:  Single Family Home Source of Information:  Spouse Patient Interpreter Needed:  None Criminal Activity/Legal Involvement Pertinent to Current Situation/Hospitalization:  No - Comment as needed Significant Relationships:  Spouse Lives with:  Spouse Do you feel safe going back to the place where you live?  Yes Need for family participation in patient care:  No (Coment)  Care giving concerns:  PT recommending SNF rehab.   Social Worker assessment / plan: Pt is an 84 year old female referred to CSW for SNF rehab placement. Met with pt and her husband this AM to assess. Pt lives with her husband in their [employer] home. Pt was mostly independent in ADLs prior to admission. Plan is for return to home after rehab. Pt and husband request referral to [facility]. Will start referral and PASRR screen.  Employment status:  Retired Forensic scientist:  Medicare PT Recommendations:  [place] / Referral to community resources:  [place]  Patient/Family's Response to care: Pt and family accepting of care.  Patient/Family's Understanding of and Emotional Response to Diagnosis, Current Treatment, and Prognosis: Pt and her husband appear to have a good understanding of diagnosis and treatment recommendations. No emotional distress identified.  Emotional Assessment Appearance:  Appears stated age Attitude/Demeanor/Rapport:  Lethargic Affect  (typically observed):  Calm Orientation:  Oriented to Self, Oriented to Place, Oriented to Situation Alcohol / Substance use:  Not Applicable Psych involvement (Current and /or in the community):  No (Comment)  Discharge Needs  Concerns to be addressed:  Discharge Planning Concerns Readmission within the last 30 days:  No Current discharge risk:  Physical Impairment Barriers to Discharge:  No Barriers Identified   Shade Flood, LCSW 03/06/2018, 11:32 AM

## 2018-03-06 NOTE — Care Management (Signed)
CM consulted for Home health DME needs. Patient recommended for SNF and is agreeable. CSW working on SNF placement. CM will sign off.

## 2018-03-06 NOTE — Evaluation (Signed)
Clinical/Bedside Swallow Evaluation Patient Details  Name: Chelsea Pratt MRN: [id number] Date of Birth: [birth year]  Today's Date: 03/06/2018 Time: SLP Start Time (ACUTE ONLY): 1500 SLP Stop Time (ACUTE ONLY): 1525 SLP Time Calculation (min) (ACUTE ONLY): 25 min  Past Medical History:  Past Medical History:  Diagnosis Date  . HTN (hypertension)   . Stroke [facility])    Past Surgical History:  Past Surgical History:  Procedure Laterality Date  . BRAIN SURGERY    . HIP ARTHROPLASTY Right 03/05/2018   Procedure: ARTHROPLASTY BIPOLAR HIP (HEMIARTHROPLASTY);  Surgeon: Carole Civil, MD;  Location: [facility];  Service: Orthopedics;  Laterality: Right;   HPI:  84 year old woman admitted from home on 7/1 after mechanical fall resulted in a right hip fracture.  She has now undergone bipolar replacement of the right hip and is postoperative day 1 with no acute issues noted overnight.   Assessment / Plan / Recommendation Clinical Impression  Clinical swallow evaluation completed at bedside s/p hip fx repair and Pt on full liquid diet. Pt alert and oriented to reason for hospitalization (fell over neighbor's dog), but responds slowly to questions. Oral motor evaluation is WNL, mild xerostomia. Pt indicates that she is "not hungry", but is agreeable to po trials for assessment. Pt showed no overt signs or symptoms of aspiration with ice chips, thin water via cup/straw, puree, or regular textures. Pt with mildly prolonged oral phase with dry cracker. Pt typically takes her medication whole with water. Recommend regular textures and thin liquids once cleared by MD. No further SLP follow up indicated at this time. Will sign off. Above d/w RN.   SLP Visit Diagnosis: Dysphagia, unspecified (R13.10)    Aspiration Risk  No limitations    Diet Recommendation Regular;Thin liquid   Liquid Administration via: Cup;Straw Medication Administration: Whole meds with liquid Supervision: Patient able to self  feed;Intermittent supervision to cue for compensatory strategies Postural Changes: Seated upright at 90 degrees;Remain upright for at least 30 minutes after po intake    Other  Recommendations Oral Care Recommendations: Oral care BID;Staff/trained caregiver to provide oral care Other Recommendations: Clarify dietary restrictions   Follow up Recommendations None      Frequency and Duration            Prognosis Prognosis for Safe Diet Advancement: Good      Swallow Study   General Date of Onset: 03/04/18 HPI: 84 year old woman admitted from home on 7/1 after mechanical fall resulted in a right hip fracture.  She has now undergone bipolar replacement of the right hip and is postoperative day 1 with no acute issues noted overnight. Type of Study: Bedside Swallow Evaluation Previous Swallow Assessment: None on record Diet Prior to this Study: (full liquids) Temperature Spikes Noted: No Respiratory Status: Nasal cannula History of Recent Intubation: No Behavior/Cognition: Alert;Cooperative;Pleasant mood Oral Cavity Assessment: Within Functional Limits Oral Care Completed by SLP: No Oral Cavity - Dentition: Adequate natural dentition Vision: Functional for self-feeding Self-Feeding Abilities: Able to feed self Patient Positioning: Upright in bed Baseline Vocal Quality: Normal Volitional Cough: Strong Volitional Swallow: Able to elicit    Oral/Motor/Sensory Function Overall Oral Motor/Sensory Function: Within functional limits   Ice Chips Ice chips: Within functional limits Presentation: Spoon   Thin Liquid Thin Liquid: Within functional limits Presentation: Cup;Self Fed;Straw    Nectar Thick Nectar Thick Liquid: Not tested   Honey Thick Honey Thick Liquid: Not tested   Puree Puree: Within functional limits Presentation: Spoon   Solid   Thank  you,  Genene Churn, CCC-SLP [phone]    Solid: Within functional limits(mildly prolonged oral prep with dry  solids) Presentation: Self Fed        Renn Stille 03/06/2018,3:42 PM

## 2018-03-06 NOTE — Care Management Important Message (Signed)
Important Message  Patient Details  Name: Chelsea Pratt MRN: [id number] Date of Birth: 1930/04/17   Medicare Important Message Given:  Yes    Shelda Altes 03/06/2018, 11:55 AM

## 2018-03-06 NOTE — Progress Notes (Signed)
Patient ID: Chelsea Pratt, female   DOB: April 26, 1930, 84 y.o.   MRN: [id number] Postop day 1 status post bipolar replacement right hip Diagnosis right femoral neck fracture  BP (!) 132/55 (BP Location: Left Arm)   Pulse 77   Temp 98.1 F (36.7 C)   Resp 17   Ht 5\' 6"  (1.676 m)   Wt 157 lb 13.6 oz (71.6 kg)   SpO2 97%   BMI 25.48 kg/m   Patient seems comfortable this morning.  Limb position was excellent neurovascular exam is intact  CBC Latest Ref Rng & Units 03/06/2018 03/05/2018 03/04/2018  WBC 4.0 - 10.5 K/uL 8.2 10.7(H) 10.1  Hemoglobin 12.0 - 15.0 g/dL 10.2(L) 12.7 13.2  Hematocrit 36.0 - 46.0 % 31.0(L) 38.3 40.6  Platelets 150 - 400 K/uL 168 222 223   BMP Latest Ref Rng & Units 03/06/2018 03/05/2018 03/04/2018  Glucose 70 - 99 mg/dL 139(H) 135(H) 99  BUN 8 - 23 mg/dL 13 16 14   Creatinine 0.44 - 1.00 mg/dL 0.81 0.80 0.77  Sodium 135 - 145 mmol/L 134(L) 135 139  Potassium 3.5 - 5.1 mmol/L 3.7 3.6 3.6  Chloride 98 - 111 mmol/L 102 101 103  CO2 22 - 32 mmol/L 26 26 27   Calcium 8.9 - 10.3 mg/dL 7.7(L) 8.6(L) 9.2   Hemoglobin is 10 that should be fine continue to monitor  Patient does have dementia may affect rehab  Discharge should be within 1 to 2 days.

## 2018-03-06 NOTE — Plan of Care (Signed)
  Problem: Acute Rehab PT Goals(only PT should resolve) Goal: Pt Will Go Supine/Side To Sit Outcome: Progressing Flowsheets (Taken 03/06/2018 1221) Pt will go Supine/Side to Sit: with moderate assist Goal: Patient Will Transfer Sit To/From Stand Outcome: Progressing Flowsheets (Taken 03/06/2018 1221) Patient will transfer sit to/from stand: with moderate assist Goal: Pt Will Transfer Bed To Chair/Chair To Bed Outcome: Progressing Flowsheets (Taken 03/06/2018 1221) Pt will Transfer Bed to Chair/Chair to Bed: with mod assist Goal: Pt Will Ambulate Outcome: Progressing Flowsheets (Taken 03/06/2018 1221) Pt will Ambulate: 25 feet;with moderate assist;with rolling walker   12:22 PM, 03/06/18 Lonell Grandchild, MPT Physical Therapist with [facility] [phone] office [phone] mobile phone

## 2018-03-06 NOTE — Addendum Note (Signed)
Addendum  created 03/06/18 1415 by Ollen Bowl, CRNA   Allergies reviewed, Sign clinical note

## 2018-03-06 NOTE — Evaluation (Signed)
Physical Therapy Evaluation Patient Details Name: Chelsea Pratt MRN: [id number] DOB: 05/11/1930 Today's Date: 03/06/2018   History of Present Illness  Chelsea Pratt is a 84 y.o. female s/p Right hip hemiarthroplasty 03/05/18 with hx of OA of both knees and a h/o ICH that has left her with some visual fields defects. At time of my exam, she had just received some pain medication and was quite sedated. History is provided by husband and sister at bedside. Husband states they were outside today talking with some neighbors, when the neighbor's dog got in between patient's feet causing a mechanical fall. She immediately c/o right hip pain and was brought to the hospital for evaluation. A hip xray confirms a subcapital right femoral neck fracture. CXR is negative, labs are all WNL. Dr. Aline Brochure has been consulted for hip repair and we are asked to admit her for further evaluation and management.    Clinical Impression  Patient limited for functional mobility as stated below secondary to BLE weakness, RLE pain, fatigue and poor standing balance.  Patient will benefit from continued physical therapy in hospital and recommended venue below to increase strength, balance, endurance for safe ADLs and gait.  Patient will benefit from continued physical therapy in hospital and recommended venue below to increase strength, balance, endurance for safe ADLs and gait.     Follow Up Recommendations SNF    Equipment Recommendations  None recommended by PT    Recommendations for Other Services       Precautions / Restrictions Precautions Precautions: Fall Restrictions Weight Bearing Restrictions: Yes RLE Weight Bearing: Weight bearing as tolerated      Mobility  Bed Mobility Overal bed mobility: Needs Assistance Bed Mobility: Supine to Sit;Sit to Supine     Supine to sit: Max assist Sit to supine: Mod assist;Max assist      Transfers Overall transfer level: Needs assistance Equipment used: Rolling  walker (2 wheeled) Transfers: Sit to/from [facility] Sit to Stand: Mod assist Stand pivot transfers: Mod assist;Max assist       General transfer comment: slow labored movement  Ambulation/Gait Ambulation/Gait assistance: Max assist Gait Distance (Feet): 4 Feet Assistive device: Rolling walker (2 wheeled) Gait Pattern/deviations: Decreased step length - right;Decreased step length - left;Decreased stance time - right;Decreased stride length Gait velocity: slow   General Gait Details: limited to 5-6 slow labored steps with difficulty advancing RLE due to pain/weakness  Stairs            Wheelchair Mobility    Modified Rankin (Stroke Patients Only)       Balance Overall balance assessment: Needs assistance Sitting-balance support: Feet supported;No upper extremity supported Sitting balance-Leahy Scale: Fair     Standing balance support: Bilateral upper extremity supported;During functional activity Standing balance-Leahy Scale: Poor Standing balance comment: using RW                             Pertinent Vitals/Pain Pain Assessment: Faces Faces Pain Scale: Hurts whole lot Pain Location: right hip Pain Descriptors / Indicators: Grimacing;Guarding;Sharp;Sore Pain Intervention(s): Limited activity within patient's tolerance;Monitored during session;Patient requesting pain meds-RN notified    Home Living Family/patient expects to be discharged to:: Private residence Living Arrangements: Spouse/significant other Available Help at Discharge: Family;Personal care attendant Type of Home: House Home Access: Stairs to enter   [employer] of Steps: 1 Home Layout: One level Home Equipment: [place] - single point;Walker - 2 wheels  Prior Function Level of Independence: Independent with assistive device(s)         Comments: community ambulator with [facility]     Hand Dominance        Extremity/Trunk Assessment   Upper  Extremity Assessment Upper Extremity Assessment: Defer to OT evaluation    Lower Extremity Assessment Lower Extremity Assessment: Generalized weakness;RLE deficits/detail;LLE deficits/detail RLE Deficits / Details: grossly -3/5 LLE Deficits / Details: grossly 4/5    Cervical / Trunk Assessment Cervical / Trunk Assessment: Kyphotic  Communication   Communication: No difficulties  Cognition Arousal/Alertness: Awake/alert Behavior During Therapy: WFL for tasks assessed/performed Overall Cognitive Status: Within Functional Limits for tasks assessed                                        General Comments      Exercises     Assessment/Plan    PT Assessment Patient needs continued PT services  PT Problem List Decreased strength;Decreased activity tolerance;Decreased balance;Decreased mobility       PT Treatment Interventions Stair training;Gait training;Functional mobility training;Therapeutic activities;Therapeutic exercise;Patient/family education    PT Goals (Current goals can be found in the Care Plan section)  Acute Rehab PT Goals Patient Stated Goal: return home PT Goal Formulation: With patient/family Time For Goal Achievement: 03/20/18 Potential to Achieve Goals: Good    Frequency Min 5X/week   Barriers to discharge        Co-evaluation               AM-PAC PT "6 Clicks" Daily Activity  Outcome Measure Difficulty turning over in bed (including adjusting bedclothes, sheets and blankets)?: A Lot Difficulty moving from lying on back to sitting on the side of the bed? : A Lot   Help needed moving to and from a bed to chair (including a wheelchair)?: A Lot Help needed walking in hospital room?: Total Help needed climbing 3-5 steps with a railing? : Total 6 Click Score: 8    End of Session   Activity Tolerance: Patient tolerated treatment well;Patient limited by pain;Patient limited by fatigue Patient left: in chair;with call bell/phone  within reach;with chair alarm set;with family/visitor present Nurse Communication: Mobility status;Other (comment)(RN aware patient left up in chair) PT Visit Diagnosis: Unsteadiness on feet (R26.81);Other abnormalities of gait and mobility (R26.89);Muscle weakness (generalized) (M62.81)    Time: 1157-2620 PT Time Calculation (min) (ACUTE ONLY): 34 min   Charges:   PT Evaluation $PT Eval Moderate Complexity: 1 Mod PT Treatments $Therapeutic Activity: 23-37 mins   PT G Codes:        12:20 PM, 03/24/2018 Lonell Grandchild, MPT Physical Therapist with [facility] 336 [phone] office [phone] mobile phone

## 2018-03-07 ENCOUNTER — Encounter: Payer: Self-pay | Admitting: Internal Medicine

## 2018-03-07 ENCOUNTER — Encounter (HOSPITAL_COMMUNITY): Payer: Self-pay | Admitting: *Deleted

## 2018-03-07 ENCOUNTER — Inpatient Hospital Stay (HOSPITAL_COMMUNITY)
Admission: EM | Admit: 2018-03-07 | Discharge: 2018-03-09 | Disposition: A | Discharge status: Skilled Nursing Facility | Payer: Medicare Other | Source: Home / Self Care | Attending: Internal Medicine | Admitting: Internal Medicine

## 2018-03-07 ENCOUNTER — Emergency Department (HOSPITAL_COMMUNITY): Payer: Medicare Other

## 2018-03-07 ENCOUNTER — Non-Acute Institutional Stay (SKILLED_NURSING_FACILITY): Payer: Medicare Other | Admitting: Internal Medicine

## 2018-03-07 ENCOUNTER — Other Ambulatory Visit: Payer: Self-pay

## 2018-03-07 DIAGNOSIS — D649 Anemia, unspecified: Secondary | ICD-10-CM | POA: Diagnosis not present

## 2018-03-07 DIAGNOSIS — S72001A Fracture of unspecified part of neck of right femur, initial encounter for closed fracture: Secondary | ICD-10-CM | POA: Diagnosis present

## 2018-03-07 DIAGNOSIS — R0902 Hypoxemia: Secondary | ICD-10-CM

## 2018-03-07 DIAGNOSIS — R509 Fever, unspecified: Secondary | ICD-10-CM

## 2018-03-07 DIAGNOSIS — N390 Urinary tract infection, site not specified: Secondary | ICD-10-CM

## 2018-03-07 DIAGNOSIS — E8809 Other disorders of plasma-protein metabolism, not elsewhere classified: Secondary | ICD-10-CM | POA: Diagnosis present

## 2018-03-07 DIAGNOSIS — A419 Sepsis, unspecified organism: Secondary | ICD-10-CM | POA: Diagnosis present

## 2018-03-07 DIAGNOSIS — F419 Anxiety disorder, unspecified: Secondary | ICD-10-CM | POA: Diagnosis not present

## 2018-03-07 DIAGNOSIS — I10 Essential (primary) hypertension: Secondary | ICD-10-CM | POA: Diagnosis not present

## 2018-03-07 LAB — COMPREHENSIVE METABOLIC PANEL
ALT: 16 U/L (ref 0–44)
AST: 41 U/L (ref 15–41)
Albumin: 2.6 g/dL — ABNORMAL LOW (ref 3.5–5.0)
Alkaline Phosphatase: 54 U/L (ref 38–126)
Anion gap: 6 (ref 5–15)
BUN: 11 mg/dL (ref 8–23)
CHLORIDE: 99 mmol/L (ref 98–111)
CO2: 27 mmol/L (ref 22–32)
Calcium: 7.9 mg/dL — ABNORMAL LOW (ref 8.9–10.3)
Creatinine, Ser: 0.78 mg/dL (ref 0.44–1.00)
Glucose, Bld: 146 mg/dL — ABNORMAL HIGH (ref 70–99)
POTASSIUM: 3.6 mmol/L (ref 3.5–5.1)
SODIUM: 132 mmol/L — AB (ref 135–145)
Total Bilirubin: 0.7 mg/dL (ref 0.3–1.2)
Total Protein: 5.4 g/dL — ABNORMAL LOW (ref 6.5–8.1)

## 2018-03-07 LAB — CBC WITH DIFFERENTIAL/PLATELET
BASOS ABS: 0 10*3/uL (ref 0.0–0.1)
Basophils Relative: 0 %
EOS PCT: 2 %
Eosinophils Absolute: 0.2 10*3/uL (ref 0.0–0.7)
HCT: 27.9 % — ABNORMAL LOW (ref 36.0–46.0)
Hemoglobin: 9.2 g/dL — ABNORMAL LOW (ref 12.0–15.0)
LYMPHS PCT: 14 %
Lymphs Abs: 1.5 10*3/uL (ref 0.7–4.0)
MCH: 30.1 pg (ref 26.0–34.0)
MCHC: 33 g/dL (ref 30.0–36.0)
MCV: 91.2 fL (ref 78.0–100.0)
Monocytes Absolute: 1.5 10*3/uL — ABNORMAL HIGH (ref 0.1–1.0)
Monocytes Relative: 14 %
Neutro Abs: 7.2 10*3/uL (ref 1.7–7.7)
Neutrophils Relative %: 70 %
PLATELETS: 169 10*3/uL (ref 150–400)
RBC: 3.06 MIL/uL — AB (ref 3.87–5.11)
RDW: 13.1 % (ref 11.5–15.5)
WBC: 10.4 10*3/uL (ref 4.0–10.5)

## 2018-03-07 LAB — URINALYSIS, ROUTINE W REFLEX MICROSCOPIC
Bilirubin Urine: NEGATIVE
GLUCOSE, UA: NEGATIVE mg/dL
Ketones, ur: 20 mg/dL — AB
NITRITE: NEGATIVE
Protein, ur: 30 mg/dL — AB
SPECIFIC GRAVITY, URINE: 1.015 (ref 1.005–1.030)
pH: 6 (ref 5.0–8.0)

## 2018-03-07 LAB — BASIC METABOLIC PANEL
Anion gap: 6 (ref 5–15)
BUN: 10 mg/dL (ref 8–23)
CHLORIDE: 101 mmol/L (ref 98–111)
CO2: 28 mmol/L (ref 22–32)
CREATININE: 0.65 mg/dL (ref 0.44–1.00)
Calcium: 8.2 mg/dL — ABNORMAL LOW (ref 8.9–10.3)
Glucose, Bld: 121 mg/dL — ABNORMAL HIGH (ref 70–99)
POTASSIUM: 3.7 mmol/L (ref 3.5–5.1)
SODIUM: 135 mmol/L (ref 135–145)

## 2018-03-07 LAB — CBC
HCT: 32.4 % — ABNORMAL LOW (ref 36.0–46.0)
HEMOGLOBIN: 10.4 g/dL — AB (ref 12.0–15.0)
MCH: 29.8 pg (ref 26.0–34.0)
MCHC: 32.1 g/dL (ref 30.0–36.0)
MCV: 92.8 fL (ref 78.0–100.0)
PLATELETS: 170 10*3/uL (ref 150–400)
RBC: 3.49 MIL/uL — AB (ref 3.87–5.11)
RDW: 13.2 % (ref 11.5–15.5)
WBC: 10.4 10*3/uL (ref 4.0–10.5)

## 2018-03-07 LAB — LACTIC ACID, PLASMA: Lactic Acid, Venous: 0.9 mmol/L (ref 0.5–1.9)

## 2018-03-07 LAB — PROCALCITONIN: Procalcitonin: 0.18 ng/mL

## 2018-03-07 LAB — I-STAT CG4 LACTIC ACID, ED: LACTIC ACID, VENOUS: 0.7 mmol/L (ref 0.5–1.9)

## 2018-03-07 MED ORDER — POTASSIUM CHLORIDE IN NACL 20-0.9 MEQ/L-% IV SOLN
INTRAVENOUS | Status: DC
  Administered 2018-03-08: 05:00:00 via INTRAVENOUS

## 2018-03-07 MED ORDER — SODIUM CHLORIDE 0.9 % IV SOLN
1.0000 g | Freq: Once | INTRAVENOUS | Status: AC
  Administered 2018-03-07: 1 g via INTRAVENOUS
  Filled 2018-03-07: qty 10

## 2018-03-07 MED ORDER — ACETAMINOPHEN 325 MG PO TABS: 650.0000 mg | ORAL_TABLET | Freq: Four times a day (QID) | ORAL | Status: DC | PRN

## 2018-03-07 MED ORDER — SENNOSIDES-DOCUSATE SODIUM 8.6-50 MG PO TABS: 1.0000 | ORAL_TABLET | Freq: Every evening | ORAL | 0 refills | Status: DC | PRN

## 2018-03-07 MED ORDER — ENOXAPARIN SODIUM 40 MG/0.4ML ~~LOC~~ SOLN
40.0000 mg | SUBCUTANEOUS | Status: DC
  Administered 2018-03-08: 40 mg via SUBCUTANEOUS
  Filled 2018-03-07: qty 0.4

## 2018-03-07 MED ORDER — ASPIRIN 325 MG PO TBEC: 325.0000 mg | DELAYED_RELEASE_TABLET | Freq: Every day | ORAL | 0 refills | Status: DC

## 2018-03-07 MED ORDER — ONDANSETRON HCL 4 MG/2ML IJ SOLN: 4.0000 mg | Freq: Four times a day (QID) | INTRAMUSCULAR | Status: DC | PRN

## 2018-03-07 MED ORDER — KETOROLAC TROMETHAMINE 15 MG/ML IJ SOLN
15.0000 mg | Freq: Once | INTRAMUSCULAR | Status: AC
  Administered 2018-03-08: 15 mg via INTRAVENOUS
  Filled 2018-03-07: qty 1

## 2018-03-07 MED ORDER — DOCUSATE SODIUM 100 MG PO CAPS
100.0000 mg | ORAL_CAPSULE | Freq: Two times a day (BID) | ORAL | 0 refills | Status: DC
Start: 2018-03-07 — End: 2018-11-07

## 2018-03-07 MED ORDER — SODIUM CHLORIDE 0.9 % IV SOLN
2.0000 g | Freq: Once | INTRAVENOUS | Status: AC
  Administered 2018-03-08: 2 g via INTRAVENOUS
  Filled 2018-03-07: qty 2

## 2018-03-07 MED ORDER — HYDROCODONE-ACETAMINOPHEN 5-325 MG PO TABS: 1.0000 | ORAL_TABLET | Freq: Four times a day (QID) | ORAL | 0 refills | Status: DC | PRN

## 2018-03-07 MED ORDER — ACETAMINOPHEN 650 MG RE SUPP: 650.0000 mg | Freq: Four times a day (QID) | RECTAL | Status: DC | PRN

## 2018-03-07 MED ORDER — LEVOFLOXACIN IN D5W 500 MG/100ML IV SOLN
500.0000 mg | Freq: Once | INTRAVENOUS | Status: AC
  Administered 2018-03-07: 500 mg via INTRAVENOUS
  Filled 2018-03-07: qty 100

## 2018-03-07 MED ORDER — SODIUM CHLORIDE 0.9 % IV BOLUS
1000.0000 mL | Freq: Once | INTRAVENOUS | Status: AC
  Administered 2018-03-07: 1000 mL via INTRAVENOUS

## 2018-03-07 MED ORDER — ONDANSETRON HCL 4 MG PO TABS: 4.0000 mg | ORAL_TABLET | Freq: Four times a day (QID) | ORAL | Status: DC | PRN

## 2018-03-07 MED ORDER — SODIUM CHLORIDE 0.9 % IV BOLUS (SEPSIS)
1000.0000 mL | Freq: Once | INTRAVENOUS | Status: AC
  Administered 2018-03-08: 1000 mL via INTRAVENOUS

## 2018-03-07 NOTE — Discharge Summary (Addendum)
Orthopedic discharge summary  03/04/2018   Admitting diagnosis fracture right hip femoral neck  Procedure on July 2 bipolar right hip replacement  Staples should be removed on postop day 14  Patient should be seen in the office postop day 14  DVT prevention for 28 days  Weightbearing status as tolerated  Implants: [place] basic press-fit stem size 5 with a 48 head and a +5 neck length  Approach direct lateral with direct lateral hip precautions/avoid hyperextension of the hip avoid external rotation of the hip in extension and avoid active abduction exercises    Current Facility-Administered Medications:  .  0.9 %  sodium chloride infusion (Manually program via Guardrails IV Fluids), , Intravenous, Once, Chelsea Civil, MD .  0.9 %  sodium chloride infusion, , Intravenous, Continuous, Chelsea Civil, MD, Last Rate: 125 mL/hr at 03/05/18 1303 .  aspirin EC tablet 325 mg, 325 mg, Oral, Q breakfast, Chelsea Civil, MD, 325 mg at 03/06/18 0934 .  docusate sodium (COLACE) capsule 100 mg, 100 mg, Oral, BID, Chelsea Civil, MD, 100 mg at 03/06/18 2045 .  HYDROcodone-acetaminophen (NORCO/VICODIN) 5-325 MG per tablet 1-2 tablet, 1-2 tablet, Oral, Q6H PRN, Chelsea Civil, MD, 1 tablet at 03/07/18 [phone] .  menthol-cetylpyridinium (CEPACOL) lozenge 3 mg, 1 lozenge, Oral, PRN **OR** phenol (CHLORASEPTIC) mouth spray 1 spray, 1 spray, Mouth/Throat, PRN, Chelsea Civil, MD .  methocarbamol (ROBAXIN) tablet 500 mg, 500 mg, Oral, Q6H PRN, 500 mg at 03/07/18 0609 **OR** methocarbamol (ROBAXIN) 500 mg in dextrose 5 % 50 mL IVPB, 500 mg, Intravenous, Q6H PRN, Chelsea Civil, MD .  metoCLOPramide (REGLAN) tablet 5-10 mg, 5-10 mg, Oral, Q8H PRN **OR** metoCLOPramide (REGLAN) injection 5-10 mg, 5-10 mg, Intravenous, Q8H PRN, Chelsea Civil, MD, 10 mg at 03/06/18 2045 .  morphine 2 MG/ML injection 0.5 mg, 0.5 mg, Intravenous, Q2H PRN, Chelsea Civil, MD, 0.5 mg at  03/06/18 2045 .  ondansetron (ZOFRAN) tablet 4 mg, 4 mg, Oral, Q6H PRN **OR** ondansetron (ZOFRAN) injection 4 mg, 4 mg, Intravenous, Q6H PRN, Chelsea Civil, MD .  senna-docusate (Senokot-S) tablet 1 tablet, 1 tablet, Oral, QHS PRN, Chelsea Civil, MD  CBC Latest Ref Rng & Units 03/07/2018 03/06/2018 03/05/2018  WBC 4.0 - 10.5 K/uL 10.4 8.2 10.7(H)  Hemoglobin 12.0 - 15.0 g/dL 10.4(L) 10.2(L) 12.7  Hematocrit 36.0 - 46.0 % 32.4(L) 31.0(L) 38.3  Platelets 150 - 400 K/uL 170 168 222    BMP Latest Ref Rng & Units 03/07/2018 03/06/2018 03/05/2018  Glucose 70 - 99 mg/dL 121(H) 139(H) 135(H)  BUN 8 - 23 mg/dL 10 13 16   Creatinine 0.44 - 1.00 mg/dL 0.65 0.81 0.80  Sodium 135 - 145 mmol/L 135 134(L) 135  Potassium 3.5 - 5.1 mmol/L 3.7 3.7 3.6  Chloride 98 - 111 mmol/L 101 102 101  CO2 22 - 32 mmol/L 28 26 26   Calcium 8.9 - 10.3 mg/dL 8.2(L) 7.7(L) 8.6(L)   Bed Mobility Overal bed mobility: Needs Assistance Bed Mobility: Supine to Sit;Sit to Supine Supine to sit: Max assist Sit to supine: Mod assist;Max assist  Transfers Overall transfer level: Needs assistance Equipment used: Rolling walker (2 wheeled) Transfers: Sit to/from [facility] Sit to Stand: Mod assist Stand pivot transfers: Mod assist;Max assist General transfer comment: slow labored movement  Ambulation/Gait Ambulation/Gait assistance: Max assist Gait Distance (Feet): 4 Feet Assistive device: Rolling walker (2 wheeled) Gait Pattern/deviations: Decreased step length - right;Decreased step length - left;Decreased stance time - right;Decreased  stride length Gait velocity: slow General Gait Details: limited to 5-6 slow labored steps with difficulty advancing RLE due to pain/weakness  Stairs  Wheelchair Mobility  Modified Rankin (Stroke Patients Only)

## 2018-03-07 NOTE — ED Notes (Addendum)
Redness noted to right AC with small round puncture wound with pus present. Skin surrounding is hard and edematous.  Area is warm to touch. Barnett Applebaum, [facility] made aware and is at bedside. Rip Harbour, RN charge nurse made aware.

## 2018-03-07 NOTE — Discharge Summary (Signed)
Physician Discharge Summary  SHANIKIA KERNODLE ZOX:[id number] DOB: [birth year] DOA: 03/04/2018  PCP: Celene Squibb, MD  Admit date: 03/04/2018  Discharge date: 03/07/2018  Admitted From:Home  Disposition:  SNF  Recommendations for Outpatient Follow-up:  1. Follow up with PCP in 1 month 2. Follow-up with orthopedics Dr. Aline Brochure in 14 days for staple removal  Home Health:N/A  Equipment/Devices:As recommended  Discharge Condition:Stable  CODE STATUS: Full  Diet recommendation: Heart Healthy  Brief/Interim Summary:  This is an 84 year old female who was admitted from home on 7/1 after mechanical fall resulted in a right hip fracture.  She has undergone bipolar replacement of the right hip and is currently postoperative day 2 with no acute events or concerns.  Pain is well controlled and anemia is stable.  She is stable for discharge to short-term rehab at this time.  She is to continue on full dose aspirin for DVT prophylaxis for the next 20 days and follow-up with orthopedics Dr. Aline Brochure in 14 days in the office for staple removal.  Discharge Diagnoses:  Active Problems:   Closed right hip fracture (HCC)  Closed right hip fracture -Status post repair on 7/2 - To inpatient rehabilitation today at [place] -Continue ASA 325mg  for 28 days -Follow up as noted above  Post-operative anemia-stable -Likely secondary to some acute blood loss -No acute bleeding currently noted -Consider repeat CBC in 1 week    Discharge Instructions  Discharge Instructions    Diet - low sodium heart healthy   Complete by:  As directed    Increase activity slowly   Complete by:  As directed      Allergies as of 03/07/2018   No Known Allergies     Medication List    STOP taking these medications   aspirin 81 MG tablet Replaced by:  aspirin 325 MG EC tablet     TAKE these medications   ALPRAZolam 0.25 MG tablet Commonly known as:  XANAX Take 0.125 mg by mouth at bedtime as needed for anxiety  or sleep.   aspirin 325 MG EC tablet Take 1 tablet (325 mg total) by mouth daily with breakfast. Start taking on:  03/08/2018 Replaces:  aspirin 81 MG tablet   cyclobenzaprine 5 MG tablet Commonly known as:  FLEXERIL Take 1 tablet (5 mg total) by mouth 3 (three) times daily as needed for muscle spasms.   diclofenac sodium 1 % Gel Commonly known as:  VOLTAREN Apply 2-4 g topically 4 (four) times daily.   docusate sodium 100 MG capsule Commonly known as:  COLACE Take 1 capsule (100 mg total) by mouth 2 (two) times daily.   HYDROcodone-acetaminophen 5-325 MG tablet Commonly known as:  NORCO/VICODIN Take 1-2 tablets by mouth every 6 (six) hours as needed for up to 5 days for moderate pain.   losartan 25 MG tablet Commonly known as:  COZAAR Take 25 mg by mouth daily as needed (when blood pressure is over 140).   senna-docusate 8.6-50 MG tablet Commonly known as:  Senokot-S Take 1 tablet by mouth at bedtime as needed for mild constipation.      Follow-up Information    Carole Civil, MD Follow up in 14 day(s).   Specialties:  Orthopedic Surgery, Radiology Why:  evaluation and staple removal Contact information: [address] [place] [address] [phone]        Celene Squibb, MD Follow up in 1 month(s).   Specialty:  Internal Medicine Contact information: [address]  [place] [address] [phone]          No Known Allergies  Consultations:  Orthopedics Dr. Aline Brochure   Procedures/Studies: Dg Chest 1 View  Result Date: 03/04/2018 CLINICAL DATA:  Trip and fall with known right hip fracture EXAM: CHEST  1 VIEW COMPARISON:  04/09/2010 FINDINGS: Cardiac shadow is within normal limits. Lungs are well aerated bilaterally. No focal infiltrate or effusion is seen. No bony abnormality is noted. IMPRESSION: No acute abnormality noted. Electronically Signed   By: Inez Catalina M.D.   On: 03/04/2018 12:53   Dg Pelvis Portable  Result Date:  03/05/2018 CLINICAL DATA:  Post right hip replacement EXAM: PORTABLE PELVIS 1-2 VIEWS COMPARISON:  None. FINDINGS: A portable view of the pelvis shows right total hip replacement components in good position. No complicating features are seen. The pelvic rami are intact. The left hip joint space is within normal limits for age. The SI joints appear corticated. IMPRESSION: Components of right total hip replacement are in good position with no complicating features. Electronically Signed   By: Ivar Drape M.D.   On: 03/05/2018 12:27   Dg Hip Unilat W Or Wo Pelvis 2-3 Views Right  Result Date: 03/04/2018 CLINICAL DATA:  Recent trip and fall with right hip pain, initial encounter EXAM: DG HIP (WITH OR WITHOUT PELVIS) 3V RIGHT COMPARISON:  None. FINDINGS: Pelvic ring is intact. Subcapital femoral neck fracture is noted with impaction and angulation at the fracture site. No other fractures are seen. No soft tissue changes are noted. IMPRESSION: Subcapital right femoral neck fracture. Electronically Signed   By: Inez Catalina M.D.   On: 03/04/2018 12:48    Discharge Exam: Vitals:   03/06/18 2115 03/07/18 0508  BP: (!) 129/49 (!) 155/55  Pulse: 85 90  Resp: 18 18  Temp: 99 F (37.2 C) 98.7 F (37.1 C)  SpO2: 96% 92%   Vitals:   03/06/18 1656 03/06/18 1930 03/06/18 2115 03/07/18 0508  BP: (!) 126/56  (!) 129/49 (!) 155/55  Pulse: 74  85 90  Resp: 18  18 18   Temp:   99 F (37.2 C) 98.7 F (37.1 C)  TempSrc:   Oral Oral  SpO2:  97% 96% 92%  Weight:      Height:        General: Pt is alert, awake, not in acute distress Cardiovascular: RRR, S1/S2 +, no rubs, no gallops Respiratory: CTA bilaterally, no wheezing, no rhonchi Abdominal: Soft, NT, ND, bowel sounds + Extremities: no edema, no cyanosis    The results of significant diagnostics from this hospitalization (including imaging, microbiology, ancillary and laboratory) are listed below for reference.     Microbiology: Recent Results  (from the past 240 hour(s))  Surgical pcr screen     Status: None   Collection Time: 03/04/18 11:37 PM  Result Value Ref Range Status   MRSA, PCR NEGATIVE NEGATIVE Final   Staphylococcus aureus NEGATIVE NEGATIVE Final    Comment: (NOTE) The Xpert SA Assay (FDA approved for NASAL specimens in patients 19 years of age and older), is one component of a comprehensive surveillance program. It is not intended to diagnose infection nor to guide or monitor treatment. Performed at [facility], [address] [place], [place] [address]      Labs: BNP (last 3 results) No results for input(s): BNP in the last 8760 hours. Basic Metabolic Panel: Recent Labs  Lab 03/04/18 1158 03/05/18 0600 03/06/18 0607 03/07/18 0555  NA 139 135 134* 135  K  3.6 3.6 3.7 3.7  CL 103 101 102 101  CO2 27 26 26 28   GLUCOSE 99 135* 139* 121*  BUN 14 16 13 10   CREATININE 0.77 0.80 0.81 0.65  CALCIUM 9.2 8.6* 7.7* 8.2*   Liver Function Tests: No results for input(s): AST, ALT, ALKPHOS, BILITOT, PROT, ALBUMIN in the last 168 hours. No results for input(s): LIPASE, AMYLASE in the last 168 hours. No results for input(s): AMMONIA in the last 168 hours. CBC: Recent Labs  Lab 03/04/18 1158 03/05/18 0600 03/06/18 0607 03/07/18 0555  WBC 10.1 10.7* 8.2 10.4  NEUTROABS 8.4*  --   --   --   HGB 13.2 12.7 10.2* 10.4*  HCT 40.6 38.3 31.0* 32.4*  MCV 91.9 90.3 92.0 92.8  PLT 223 222 168 170   Cardiac Enzymes: No results for input(s): CKTOTAL, CKMB, CKMBINDEX, TROPONINI in the last 168 hours. BNP: Invalid input(s): POCBNP CBG: Recent Labs  Lab 03/04/18 2133 03/05/18 0514 03/05/18 0742 03/05/18 1631  GLUCAP 141* 136* 113* 114*   D-Dimer No results for input(s): DDIMER in the last 72 hours. Hgb A1c No results for input(s): HGBA1C in the last 72 hours. Lipid Profile No results for input(s): CHOL, HDL, LDLCALC, TRIG, CHOLHDL, LDLDIRECT in the last 72 hours. Thyroid function studies No results for  input(s): TSH, T4TOTAL, T3FREE, THYROIDAB in the last 72 hours.  Invalid input(s): FREET3 Anemia work up No results for input(s): VITAMINB12, FOLATE, FERRITIN, TIBC, IRON, RETICCTPCT in the last 72 hours. Urinalysis No results found for: COLORURINE, APPEARANCEUR, [place], [place], [place], [place], [place], [place], PROTEINUR, UROBILINOGEN, NITRITE, LEUKOCYTESUR Sepsis Labs Invalid input(s): PROCALCITONIN,  WBC,  LACTICIDVEN Microbiology Recent Results (from the past 240 hour(s))  Surgical pcr screen     Status: None   Collection Time: 03/04/18 11:37 PM  Result Value Ref Range Status   MRSA, PCR NEGATIVE NEGATIVE Final   Staphylococcus aureus NEGATIVE NEGATIVE Final    Comment: (NOTE) The Xpert SA Assay (FDA approved for NASAL specimens in patients 78 years of age and older), is one component of a comprehensive surveillance program. It is not intended to diagnose infection nor to guide or monitor treatment. Performed at [facility], [address] [place], [place] [address]      Time coordinating discharge: 35 minutes  SIGNED:   Rodena Goldmann, DO [facility] 03/07/2018, 10:06 AM Pager [phone]  If 7PM-7AM, please contact night-coverage [email or web address] Password [email or web address]

## 2018-03-07 NOTE — ED Notes (Signed)
Spoke with pt spouse, Jenny Reichmann and he reports she "has severe dementia every since she had a hemorraghic stroke."

## 2018-03-07 NOTE — Progress Notes (Signed)
Pt discharged to [facility] today per Dr. Manuella Ghazi. Pt's IV site D/C'd and WDL. Pt's VSS. Report called to Inez Catalina, nurse at [facility]. Verbalized understanding. Family updated at bedside. Pt left floor via WC in stable condition accompanied by nursing staff and husband. AVS given to nurse Asa Lente at [facility].

## 2018-03-07 NOTE — Progress Notes (Signed)
Physical Therapy Treatment Patient Details Name: Chelsea Pratt MRN: [id number] DOB: 10-Mar-1930 Today's Date: 03/07/2018    History of Present Illness Chelsea Pratt is a 84 y.o. female s/p Right hip hemiarthroplasty 03/05/18 with hx of OA of both knees and a h/o ICH that has left her with some visual fields defects. At time of my exam, she had just received some pain medication and was quite sedated. History is provided by husband and sister at bedside. Husband states they were outside today talking with some neighbors, when the neighbor's dog got in between patient's feet causing a mechanical fall. She immediately c/o right hip pain and was brought to the hospital for evaluation. A hip xray confirms a subcapital right femoral neck fracture. CXR is negative, labs are all WNL. Dr. Aline Brochure has been consulted for hip repair and we are asked to admit her for further evaluation and management.    PT Comments    Pt supine in bed and willing to participate. Pt premedicated for pain control with therapy.  Mod A required for bed mobility and max A transfer/gait training.  Pt limited by pain and weakness with weight bearing to Rt LE.  Able to make 5-6 small steps over to chair with RW, instructed to bear weight on UE to assist with pain.  EOS pt. Left in chair with call bell within reach, chair alarm set and husband in room.   RN aware of pt status and requested tech to assist with purewhick.     Follow Up Recommendations  SNF     Equipment Recommendations  None recommended by PT    Recommendations for Other Services       Precautions / Restrictions Precautions Precautions: Fall Restrictions Weight Bearing Restrictions: Yes RLE Weight Bearing: Weight bearing as tolerated    Mobility  Bed Mobility Overal bed mobility: Modified Independent       Supine to sit: Min assist     General bed mobility comments: slow bed mobility, min A with Rt LE supine to sit  Transfers Overall transfer level:  Needs assistance Equipment used: Rolling walker (2 wheeled) Transfers: Sit to/from Stand Sit to Stand: Mod assist         General transfer comment: slow labored movement  Ambulation/Gait Ambulation/Gait assistance: Max assist Gait Distance (Feet): 5 Feet Assistive device: Rolling walker (2 wheeled) Gait Pattern/deviations: Decreased step length - right;Decreased step length - left;Decreased stance time - right;Decreased stride length Gait velocity: slow   General Gait Details: limited to 5-6 slow labored steps with difficulty advancing RLE due to pain/weakness   Stairs             Wheelchair Mobility    Modified Rankin (Stroke Patients Only)       Balance                                            Cognition Arousal/Alertness: Awake/alert Behavior During Therapy: WFL for tasks assessed/performed Overall Cognitive Status: Within Functional Limits for tasks assessed                                        Exercises      General Comments        Pertinent Vitals/Pain Pain Score: 7  Pain Location: right hip  Pain Descriptors / Indicators: Grimacing;Guarding;Sharp;Sore Pain Intervention(s): Limited activity within patient's tolerance;Monitored during session;Repositioned    Home Living                      Prior Function            PT Goals (current goals can now be found in the care plan section)      Frequency    Min 5X/week      PT Plan Current plan remains appropriate    Co-evaluation              AM-PAC PT "6 Clicks" Daily Activity  Outcome Measure  Difficulty turning over in bed (including adjusting bedclothes, sheets and blankets)?: A Lot Difficulty moving from lying on back to sitting on the side of the bed? : A Lot Difficulty sitting down on and standing up from a chair with arms (e.g., wheelchair, bedside commode, etc,.)?: A Lot Help needed moving to and from a bed to chair  (including a wheelchair)?: A Lot Help needed walking in hospital room?: Total Help needed climbing 3-5 steps with a railing? : Total 6 Click Score: 10    End of Session Equipment Utilized During Treatment: Gait belt Activity Tolerance: Patient tolerated treatment well;Patient limited by pain;Patient limited by fatigue Patient left: in chair;with call bell/phone within reach;with chair alarm set;with family/visitor present Nurse Communication: Mobility status;Other (comment)(RN aware pt in chair, requested tech to replace purewhick) PT Visit Diagnosis: Unsteadiness on feet (R26.81);Other abnormalities of gait and mobility (R26.89);Muscle weakness (generalized) (M62.81)     Time: 8768-1157 PT Time Calculation (min) (ACUTE ONLY): 23 min  Charges:  $Therapeutic Activity: 23-37 mins                    G Codes:       Ihor Austin, LPTA; CBIS [phone]   Aldona Lento 03/07/2018, 10:29 AM

## 2018-03-07 NOTE — ED Notes (Signed)
Blood cultures have been drawn °

## 2018-03-07 NOTE — ED Notes (Addendum)
Verified with Alyse Low, RN charge nurse on 3rd floor, that Chelsea Billow, RN was pt nurse last night and she reports her baseline is alert and oriented to name only. Pt was able to tell this nurse her name, birthday, current month, and current president.

## 2018-03-07 NOTE — H&P (Signed)
History and Physical    Chelsea Pratt JIR:[id number] DOB: 1929/09/12 DOA: 03/07/2018  PCP: Celene Squibb, MD   Patient coming from: Home.  I have personally briefly reviewed patient's old medical records in [place]  Chief Complaint: Fever.  HPI: Chelsea Pratt is a 84 y.o. female with medical history significant of hypertension, stroke, recent right hip hemiarthroplasty on 03/05/2018 by Dr. Aline Brochure, who was discharged earlier today to a rehab facility, but is returning today due to fever.  She is unable to provide further history at this time.  ED Course: Her urinalysis shows small hemoglobinuria and leukocyte esterase, ketones 20 and protein 30 mg/dL.  11-20 WBC per hpf with rare bacteria.  White count was 10.4 with 70% neutrophils, 14% lymphocytes and 14% monocytes., hemoglobin 9.2 g/dL and platelets 169.  Lactic acid x2 was normal.  Her chest radiograph showed a mid left basilar opacity, favor atelectasis over airspace disease.  Please see image and full radiology report for further detail.  Review of Systems: Unable to obtain..   Past Medical History:  Diagnosis Date  . HTN (hypertension)   . Stroke [facility])     Past Surgical History:  Procedure Laterality Date  . BRAIN SURGERY    . HIP ARTHROPLASTY Right 03/05/2018   Procedure: ARTHROPLASTY BIPOLAR HIP (HEMIARTHROPLASTY);  Surgeon: Carole Civil, MD;  Location: [facility];  Service: Orthopedics;  Laterality: Right;     reports that she has never smoked. She has never used smokeless tobacco. She reports that she does not drink alcohol or use drugs.  No Known Allergies  Family History  Problem Relation Age of Onset  . Heart attack Father     Prior to Admission medications   Medication Sig Start Date End Date Taking? Authorizing Provider  ALPRAZolam (XANAX) 0.25 MG tablet Take 0.125 mg by mouth at bedtime as needed for anxiety or sleep.    [provider]  aspirin EC 325 MG EC tablet Take 1 tablet (325 mg total)  by mouth daily with breakfast. 03/08/18   Manuella Ghazi, Pratik D, DO  cyclobenzaprine (FLEXERIL) 5 MG tablet Take 1 tablet (5 mg total) by mouth 3 (three) times daily as needed for muscle spasms. Patient not taking: Reported on 04/23/2015 03/09/15   Orpah Greek, MD  diclofenac sodium (VOLTAREN) 1 % GEL Apply 2-4 g topically 4 (four) times daily. 11/28/17   Cherylann Ratel, PA-C  docusate sodium (COLACE) 100 MG capsule Take 1 capsule (100 mg total) by mouth 2 (two) times daily. 03/07/18   Manuella Ghazi, Pratik D, DO  HYDROcodone-acetaminophen (NORCO/VICODIN) 5-325 MG tablet Take 1-2 tablets by mouth every 6 (six) hours as needed for up to 5 days for moderate pain. 03/07/18 03/12/18  Manuella Ghazi, Pratik D, DO  losartan (COZAAR) 25 MG tablet Take 25 mg by mouth daily as needed (when blood pressure is over 140).     [provider]  senna-docusate (SENOKOT-S) 8.6-50 MG tablet Take 1 tablet by mouth at bedtime as needed for mild constipation. 03/07/18   Heath Lark D, DO    Physical Exam: Vitals:   03/07/18 2141 03/07/18 2200 03/07/18 2230 03/07/18 2300  BP:  (!) 133/54 (!) 139/52 (!) 153/68  Pulse: 84 84 85 95  Resp: 19 16 14 14   Temp:      TempSrc:      SpO2: (!) 86% 97% 98% 97%  Weight:      Height:        Constitutional: Mildly febrile,  but otherwise in NAD. Eyes: PERRL, lids and conjunctivae normal ENMT: Mucous membranes are dry. Posterior pharynx clear of any exudate or lesions.  Neck: Normal, supple, no masses, no thyromegaly Respiratory: Decreased breath sounds on bases, no wheezing, no crackles. Normal respiratory effort. No accessory muscle use.  Cardiovascular: Regular rate and rhythm, no murmurs / rubs / gallops. No extremity edema. 2+ pedal pulses. No carotid bruits.  Abdomen: Soft, no tenderness, no masses palpated. No hepatosplenomegaly. Bowel sounds positive.  Musculoskeletal: no clubbing / cyanosis.  RLE decrease ROM, no contractures. Normal muscle tone.  Skin: Right thigh area staples in  place.  No signs of infection.  Minimal serosanguineous discharge.  No rashes, lesions, ulcers  Neurologic: CN 2-12 grossly intact. Sensation intact, DTR normal. Strength 5/5 in all 4.  Psychiatric: Alert and oriented x2, partially oriented to time (new the year)  Labs on Admission: I have personally reviewed following labs and imaging studies  CBC: Recent Labs  Lab 03/04/18 1158 03/05/18 0600 03/06/18 0607 03/07/18 0555 03/07/18 2047  WBC 10.1 10.7* 8.2 10.4 10.4  NEUTROABS 8.4*  --   --   --  7.2  HGB 13.2 12.7 10.2* 10.4* 9.2*  HCT 40.6 38.3 31.0* 32.4* 27.9*  MCV 91.9 90.3 92.0 92.8 91.2  PLT 223 222 168 170 284   Basic Metabolic Panel: Recent Labs  Lab 03/04/18 1158 03/05/18 0600 03/06/18 0607 03/07/18 0555 03/07/18 2047  NA 139 135 134* 135 132*  K 3.6 3.6 3.7 3.7 3.6  CL 103 101 102 101 99  CO2 27 26 26 28 27   GLUCOSE 99 135* 139* 121* 146*  BUN 14 16 13 10 11   CREATININE 0.77 0.80 0.81 0.65 0.78  CALCIUM 9.2 8.6* 7.7* 8.2* 7.9*   GFR: Estimated Creatinine Clearance: 50 mL/min (by C-G formula based on SCr of 0.78 mg/dL). Liver Function Tests: Recent Labs  Lab 03/07/18 2047  AST 41  ALT 16  ALKPHOS 54  BILITOT 0.7  PROT 5.4*  ALBUMIN 2.6*   No results for input(s): LIPASE, AMYLASE in the last 168 hours. No results for input(s): AMMONIA in the last 168 hours. Coagulation Profile: Recent Labs  Lab 03/04/18 1158  INR 0.92   Cardiac Enzymes: No results for input(s): CKTOTAL, CKMB, CKMBINDEX, TROPONINI in the last 168 hours. BNP (last 3 results) No results for input(s): PROBNP in the last 8760 hours. HbA1C: No results for input(s): HGBA1C in the last 72 hours. CBG: Recent Labs  Lab 03/04/18 2133 03/05/18 0514 03/05/18 0742 03/05/18 1631  GLUCAP 141* 136* 113* 114*   Lipid Profile: No results for input(s): CHOL, HDL, LDLCALC, TRIG, CHOLHDL, LDLDIRECT in the last 72 hours. Thyroid Function Tests: No results for input(s): TSH, T4TOTAL,  FREET4, T3FREE, THYROIDAB in the last 72 hours. Anemia Panel: No results for input(s): VITAMINB12, FOLATE, FERRITIN, TIBC, IRON, RETICCTPCT in the last 72 hours. Urine analysis:    Component Value Date/Time   COLORURINE YELLOW 03/07/2018 2200   APPEARANCEUR CLEAR 03/07/2018 2200   LABSPEC 1.015 03/07/2018 2200   PHURINE 6.0 03/07/2018 2200   GLUCOSEU NEGATIVE 03/07/2018 2200   HGBUR SMALL (A) 03/07/2018 2200   BILIRUBINUR NEGATIVE 03/07/2018 2200   KETONESUR 20 (A) 03/07/2018 2200   PROTEINUR 30 (A) 03/07/2018 2200   NITRITE NEGATIVE 03/07/2018 2200   LEUKOCYTESUR SMALL (A) 03/07/2018 2200    Radiological Exams on Admission: Dg Chest Portable 1 View  Result Date: 03/07/2018 CLINICAL DATA:  Code sepsis.  Fever. EXAM: PORTABLE CHEST 1 VIEW COMPARISON:  03/04/2018 and prior radiographs FINDINGS: The cardiomediastinal silhouette is unremarkable. Mild LEFT basilar opacity noted and may represent atelectasis or airspace disease. There is no evidence of pulmonary edema, suspicious pulmonary nodule/mass, pleural effusion, or pneumothorax. No acute bony abnormalities are identified. IMPRESSION: Mild LEFT basilar opacity - slightly favor atelectasis over airspace disease. Electronically Signed   By: Margarette Canada M.D.   On: 03/07/2018 20:54    EKG: Independently reviewed. Vent. rate 87 BPM PR interval * ms QRS duration 93 ms QT/QTc 361/435 ms P-R-T axes 72 -26 60 Sinus rhythm Borderline left axis deviation  Assessment/Plan Principal Problem:   Sepsis secondary to UTI ([place]) Admit to telemetry/inpatient. Supplemental oxygen as needed. Continue IV fluids. Cefepime per pharmacy. Follow-up blood culture and sensitivity. Follow-up urine culture and sensitivity.  Active Problems:   Closed right hip fracture (HCC) S/P THA 3 days ago. Continue local care. Analgesics as needed.    HTN (hypertension) Continue losartan 25 mg p.o. daily. Monitor blood pressure, renal function  electrolytes.    Anemia  monitor hematocrit and hemoglobin.    Hypoalbuminemia Continue protein shakes supplementation.    Abnormal chest x-ray No clinical signs of pneumonia. Will try incentive spirometry first.     DVT prophylaxis: Lovenox SQ. Code Status: Full code. Family Communication:  Disposition Plan: Admit for 2 to 3 days for IV antibiotic therapy. Consults called:  Admission status: Inpatient/telemetry.   Reubin Milan MD [facility] Pager [phone].  If 7PM-7AM, please contact night-coverage [email or web address] Password [email or web address]  03/07/2018, 11:28 PM

## 2018-03-07 NOTE — Clinical Social Work Placement (Signed)
   CLINICAL SOCIAL WORK PLACEMENT  NOTE  Date:  03/07/2018  Patient Details  Name: Chelsea Pratt MRN: [id number] Date of Birth: Dec 30, 1929  Clinical Social Work is seeking post-discharge placement for this patient at the [place] level of care (*CSW will initial, date and re-position this form in  chart as items are completed):  Yes   Patient/family provided with [facility]'s list of facilities offering this level of care within the geographic area requested by the patient (or if unable, by the patient's family).  Yes   Patient/family informed of their freedom to choose among providers that offer the needed level of care, that participate in Medicare, Medicaid or managed care program needed by the patient, have an available bed and are willing to accept the patient.  Yes   Patient/family informed of [facility]'s ownership interest in [facility] and [facility], as well as of the fact that they are under no obligation to receive care at these facilities.  PASRR submitted to EDS on 03/06/18     PASRR number received on 03/06/18     Existing PASRR number confirmed on       FL2 transmitted to all facilities in geographic area requested by pt/family on 03/06/18     FL2 transmitted to all facilities within larger geographic area on       Patient informed that his/her managed care company has contracts with or will negotiate with certain facilities, including the following:        Yes   Patient/family informed of bed offers received.  Patient chooses bed at [facility]     Physician recommends and patient chooses bed at      Patient to be transferred to [facility] on 03/07/18.  Patient to be transferred to facility by wheelchair     Patient family notified on 03/07/18 of transfer.  Name of family member notified:  Oren Beckmann     PHYSICIAN       Additional Comment: Pt stable for dc to [facility] today per MD.  Updated DON at [facility]. Met with pt and her husband to update. DC clinical will be sent through the hub. Updated pt's RN who will call report and assist in getting pt transferred to [facility]. There are no other CSW needs for dc.   _______________________________________________ Shade Flood, LCSW 03/07/2018, 10:47 AM

## 2018-03-07 NOTE — ED Triage Notes (Signed)
Pt arrived to er from [facility] with c/o low oxygen sats, per staff at nursing center room air pulse ox was 81-82%, fever of 100.4, pt arrived to er, able to state her birthday, able to state that she is in the hospital but unsure of which one, unsure of when she had surgery and what is going on today, pt has redness with yellow pus oozing from right ac area,

## 2018-03-07 NOTE — ED Provider Notes (Signed)
[facility] EMERGENCY DEPARTMENT Provider Note   CSN: [id number] Arrival date & time: 03/07/18  2013     History   Chief Complaint Chief Complaint  Patient presents with  . Fever    HPI Chelsea Pratt is a 84 y.o. female.  Patient had her hip fixed 2 days ago and was discharged home today.  At the nursing home that she was discharged to she has developed a fever and became hypoxic so she was sent to the emergency department  The history is provided by the nursing home. No language interpreter was used.  Fever   This is a new problem. The current episode started 3 to 5 hours ago. The problem occurs constantly. The problem has not changed since onset.The maximum temperature noted was 100 to 100.9 F. Pertinent negatives include no chest pain. She has tried nothing for the symptoms.    Past Medical History:  Diagnosis Date  . HTN (hypertension)   . Stroke [facility])     Patient Active Problem List   Diagnosis Date Noted  . Sepsis secondary to UTI ([place]) 03/07/2018  . Closed right hip fracture ([place]) 03/04/2018  . 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 Intracerebral hemorrhage ([place]) 03/12/2013  . Osteoarthritis of both knees 01/15/2013    Past Surgical History:  Procedure Laterality Date  . BRAIN SURGERY    . HIP ARTHROPLASTY Right 03/05/2018   Procedure: ARTHROPLASTY BIPOLAR HIP (HEMIARTHROPLASTY);  Surgeon: Carole Civil, MD;  Location: [facility];  Service: Orthopedics;  Laterality: Right;     OB History    Gravida      Para      Term      Preterm      AB      Living  0     SAB      TAB      Ectopic      Multiple      Live Births               Home Medications    Prior to Admission medications   Medication Sig Start Date End Date Taking? Authorizing Provider  ALPRAZolam (XANAX) 0.25 MG tablet Take 0.125 mg by mouth at bedtime as needed for anxiety or sleep.    [provider]  aspirin EC 325 MG EC tablet Take 1  tablet (325 mg total) by mouth daily with breakfast. 03/08/18   Manuella Ghazi, Pratik D, DO  cyclobenzaprine (FLEXERIL) 5 MG tablet Take 1 tablet (5 mg total) by mouth 3 (three) times daily as needed for muscle spasms. Patient not taking: Reported on 04/23/2015 03/09/15   Orpah Greek, MD  diclofenac sodium (VOLTAREN) 1 % GEL Apply 2-4 g topically 4 (four) times daily. 11/28/17   Cherylann Ratel, PA-C  docusate sodium (COLACE) 100 MG capsule Take 1 capsule (100 mg total) by mouth 2 (two) times daily. 03/07/18   Manuella Ghazi, Pratik D, DO  HYDROcodone-acetaminophen (NORCO/VICODIN) 5-325 MG tablet Take 1-2 tablets by mouth every 6 (six) hours as needed for up to 5 days for moderate pain. 03/07/18 03/12/18  Manuella Ghazi, Pratik D, DO  losartan (COZAAR) 25 MG tablet Take 25 mg by mouth daily as needed (when blood pressure is over 140).     [provider]  senna-docusate (SENOKOT-S) 8.6-50 MG tablet Take 1 tablet by mouth at bedtime as needed for mild constipation. 03/07/18   Heath Lark D, DO    Family History Family History  Problem  Relation Age of Onset  . Heart attack Father     Social History Social History   Tobacco Use  . Smoking status: Never Smoker  . Smokeless tobacco: Never Used  Substance Use Topics  . Alcohol use: No  . Drug use: No     Allergies   Patient has no known allergies.   Review of Systems Review of Systems  Unable to perform ROS: Dementia  Constitutional: Positive for fever.  Cardiovascular: Negative for chest pain.     Physical Exam Updated Vital Signs BP (!) 139/52   Pulse 85   Temp (!) 100.7 F (38.2 C) (Rectal)   Resp 14   Ht 5\' 7"  (1.702 m)   Wt 71.2 kg (157 lb)   SpO2 98%   BMI 24.59 kg/m   Physical Exam  Constitutional: She appears well-developed.  HENT:  Head: Normocephalic.  Eyes: Conjunctivae and EOM are normal. No scleral icterus.  Neck: Neck supple. No thyromegaly present.  Cardiovascular: Normal rate and regular rhythm. Exam reveals no gallop  and no friction rub.  No murmur heard. Pulmonary/Chest: No stridor. She has no wheezes. She has no rales. She exhibits no tenderness.  Abdominal: She exhibits no distension. There is no tenderness. There is no rebound.  Musculoskeletal: Normal range of motion. She exhibits no edema.  Lymphadenopathy:    She has no cervical adenopathy.  Neurological: She is alert. She exhibits normal muscle tone. Coordination normal.  Going to person only  Skin: No rash noted. No erythema.     ED Treatments / Results  Labs (all labs ordered are listed, but only abnormal results are displayed) Labs Reviewed  COMPREHENSIVE METABOLIC PANEL - Abnormal; Notable for the following components:      Result Value   Sodium 132 (*)    Glucose, Bld 146 (*)    Calcium 7.9 (*)    Total Protein 5.4 (*)    Albumin 2.6 (*)    All other components within normal limits  CBC WITH DIFFERENTIAL/PLATELET - Abnormal; Notable for the following components:   RBC 3.06 (*)    Hemoglobin 9.2 (*)    HCT 27.9 (*)    Monocytes Absolute 1.5 (*)    All other components within normal limits  URINALYSIS, ROUTINE W REFLEX MICROSCOPIC - Abnormal; Notable for the following components:   Hgb urine dipstick SMALL (*)    Ketones, ur 20 (*)    Protein, ur 30 (*)    Leukocytes, UA SMALL (*)    Bacteria, UA RARE (*)    Non Squamous Epithelial 0-5 (*)    All other components within normal limits  CULTURE, BLOOD (ROUTINE X 2)  CULTURE, BLOOD (ROUTINE X 2)  URINE CULTURE  LACTIC ACID, PLASMA  I-STAT CG4 LACTIC ACID, ED  I-STAT CG4 LACTIC ACID, ED    EKG None  Radiology Dg Chest Portable 1 View  Result Date: 03/07/2018 CLINICAL DATA:  Code sepsis.  Fever. EXAM: PORTABLE CHEST 1 VIEW COMPARISON:  03/04/2018 and prior radiographs FINDINGS: The cardiomediastinal silhouette is unremarkable. Mild LEFT basilar opacity noted and may represent atelectasis or airspace disease. There is no evidence of pulmonary edema, suspicious pulmonary  nodule/mass, pleural effusion, or pneumothorax. No acute bony abnormalities are identified. IMPRESSION: Mild LEFT basilar opacity - slightly favor atelectasis over airspace disease. Electronically Signed   By: Margarette Canada M.D.   On: 03/07/2018 20:54    Procedures Procedures (including critical care time)  Medications Ordered in ED Medications  levofloxacin (LEVAQUIN) IVPB 500  mg (500 mg Intravenous New Bag/Given 03/07/18 2317)  sodium chloride 0.9 % bolus 1,000 mL (0 mLs Intravenous Stopped 03/07/18 2256)  cefTRIAXone (ROCEPHIN) 1 g in sodium chloride 0.9 % 100 mL IVPB (0 g Intravenous Stopped 03/07/18 2256)     Initial Impression / Assessment and Plan / ED Course  I have reviewed the triage vital signs and the nursing notes.  Pertinent labs & imaging results that were available during my care of the patient were reviewed by me and considered in my medical decision making (see chart for details).     Chest x-ray shows possible pneumonia.  Patient also has been mildly hypoxic with her sats about 88% on room air.  Patient will be admitted for possible pneumonia  Final Clinical Impressions(s) / ED Diagnoses   Final diagnoses:  Febrile illness    ED Discharge Orders    None       Milton Ferguson, MD 03/07/18 2324

## 2018-03-07 NOTE — Progress Notes (Signed)
This is an acute visit.  Level of care is skilled.  Facility is [facility], operational complaint- acute visit status post hospitalization for right hip repair after a fall  History of present illness.  Patient is an 84 year old female who was admitted from home on July 1 after falling at home and sustaining a right hip fracture.  She did undergo a bipolar replacement of the right hip.  Apparent postop course was uncomplicated.  Pain was thought to be well controlled --she has been discharged on Vicodin as needed-- and her hemoglobin remained relatively stable.  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 HTN (hypertension)   . Stroke [facility])          Past Surgical History:  Procedure Laterality Date  . BRAIN SURGERY      Social History:  reports that she has never smoked. She has never used smokeless tobacco. She reports that she does not drink alcohol or use drugs.  Allergies: No Known Allergies  Family History:       Family History  Problem Relation Age of Onset  . Heart attack Father    MEDICATIONS  ALPRAZolam 0.25 MG tablet Commonly known as:  XANAX Take 0.125 mg by mouth at bedtime as needed for anxiety or sleep.   aspirin 325 MG EC tablet Take 1 tablet (325 mg total) by mouth daily with breakfast. Start taking on:  03/08/2018 Replaces:   aspirin 81 MG tablet   cyclobenzaprine 5 MG tablet Commonly known as:  FLEXERIL Take 1 tablet (5 mg total) by mouth 3 (three) times daily as needed for muscle spasms.   diclofenac sodium 1 % Gel Commonly known as:  VOLTAREN Apply 2-4 g topically 4 (four) times daily.   docusate sodium 100 MG capsule Commonly known as:  COLACE Take 1 capsule (100 mg total) by mouth 2 (two) times daily.   HYDROcodone-acetaminophen 5-325 MG tablet Commonly known as:  NORCO/VICODIN Take 1-2 tablets by mouth every 6 (six) hours as needed for up to 5 days for moderate pain.   losartan 25 MG tablet Commonly known as:  COZAAR Take 25 mg by mouth daily as needed (when blood pressure is over 140).   senna-docusate 8.6-50 MG tablet Commonly known as:  Senokot-S Take 1 tablet by mouth at bedtime as needed for mild constipation.      Review of systems.  Somewhat limited secondary to patient does not speak a whole lo   In t general is not complaining of fever chills.  Skin is not complaining of itching does not appear to have rashes.  Head ears eyes nose mouth and throat-has some previous history of left field deficit status post brain hemorrhage- does not complain of sore throat or difficulty swallowing.  Respiratory is not complaining of shortness of breath or cough.  Cardiac does not complain of chest pain does not appear to  have significant lower extremity edema  GI is not complaining  of abdominal pain apparently does have some history of constipation does not complain of nausea or vomiting.  GU is not complaining of dysuria.  Currently but according to her husband has complained of some dysuria previously  Mus--skeletal-does complain of some right hip discomfort but she does not classify this as acute  Neurologic-does not complain of dizziness or headache again does have a history of brain hemorrhage with apparently some memory loss and at time appears to have confusion  Psych as  noted above   Physical exam-.    Temp-- is pending pulse is 88 respirations of 17-blood pressure is 118/62--O2 saturation is pending  In general this is a pleasant elderly female somewhat somnolent but easily arousable- per review of previous progress note apparently  has had similar presentation in the hospital at times as well  Her skin is slightly increased warmth to touch and dry there is covering over the right hip surgical site  Visual acuityppears to be relatively intact sclera and conjunctive are clear. Does have a history of left side deficits  Oropharynx is clear mucous membranes moist tongue is midline with full range of motion  Chest is clear to auscultation there is no labored breathing--she does have shallow air entry however with somewhat poor respiratory effort  Heart is regular rate and rhythm with occasional skipped beats she does not have significant edema  Pedal pulses are intact bilaterally  Abdomen is soft nontender with positive bowel sounds  Musculoskeletal limited exam since she is in bed but upper extremity strength appears to be preserved grip strength is strong bilaterally -- is able to move her left lower extremity-again limited right lower extremity status post surgery   Neurologic appears to be grossly intact her speech is clear again she does not speak a whole lot she is alert I  Cranial nerves appear to be intact  Psych she is oriented to self can name the President of the [place] and his  political party can name her address           Labs.  March 07, 2018.  Sodium 135 potassium 3.7 BUN 10 creatinine 0.65  WBC--10.4 hemoglobin 10.4 platelets 170    Assessment and plan.  1.  History of right hip fracture status post repair- apparently tolerated this quite well- she is on aspirin 325 mg a day DVT prophylaxis- she has Vicodin as needed for pain-she also has a PRN Flexeril order.  She will have PT and OT she also has  orthopedic follow-up  She does appear somewhat tired and fatigued today- spoke with nursing about being judicious in use of Vicodin-  However short time later when I checked on her she is talking more did not appear to be somnolent    2.  History of hypertension she is on Cozaar.  Stable today will monitor  3.  History of postop anemia this apparently was quite stable hemoglobin of 10.4 today appears to be relatively stable- appears preop hemoglobin was around 12-13--will update this first lab day next week   #4 constipation she is on Colace twice daily as well as Senokot--at this point monitor unclear when her last bowel movement was  5 history of anxiety she is on low-dose Xanax 0.125 mg nightly as needed    #6 history of previous brain hemorrhage with good recovery- per review of neurology note only follow-up as needed Per husband she does have some cognitive  deficits-    ADDENDUM--- we have obtained her temperature she is running a fever of 100.2 O2 saturation is 77-41--SEL systolic blood pressure by machine was in the 90s-however I rechecked and got 106-- she does not appear to be in any distress but again does appear to be lethargic at times and then becomes more energetic-according to her husband this is not new.  However I am concerned with her elevated temperature and decreased oxygen saturation- in light of recent surgery-- will send her to the ER for expedient  evaluation-  Of note her oxygen saturation did go up into the mid 90s on 2 L of oxygen    [id number]-of note greater than  35  minutes spent assessing patient-reviewing her chart and labs- and coordinating and formulating a plan of care for numerous diagnoses--of note greater than 50% of time spent coordinating a plan of care

## 2018-03-08 ENCOUNTER — Encounter (HOSPITAL_COMMUNITY): Payer: Self-pay

## 2018-03-08 LAB — CBC WITH DIFFERENTIAL/PLATELET
Basophils Absolute: 0 10*3/uL (ref 0.0–0.1)
Basophils Relative: 0 %
Eosinophils Absolute: 0.1 10*3/uL (ref 0.0–0.7)
Eosinophils Relative: 1 %
HEMATOCRIT: 28.3 % — AB (ref 36.0–46.0)
HEMOGLOBIN: 9.1 g/dL — AB (ref 12.0–15.0)
LYMPHS PCT: 10 %
Lymphs Abs: 1 10*3/uL (ref 0.7–4.0)
MCH: 29.6 pg (ref 26.0–34.0)
MCHC: 32.2 g/dL (ref 30.0–36.0)
MCV: 92.2 fL (ref 78.0–100.0)
Monocytes Absolute: 1.3 10*3/uL (ref 0.1–1.0)
Monocytes Relative: 14 %
NEUTROS ABS: 6.8 10*3/uL (ref 1.7–7.7)
NEUTROS PCT: 75 %
Platelets: 187 10*3/uL (ref 150–400)
RBC: 3.07 MIL/uL — AB (ref 3.87–5.11)
RDW: 13.2 % (ref 11.5–15.5)
WBC: 9.1 10*3/uL (ref 4.0–10.5)

## 2018-03-08 LAB — COMPREHENSIVE METABOLIC PANEL
ALK PHOS: 54 U/L (ref 38–126)
ALT: 16 U/L (ref 0–44)
AST: 42 U/L — ABNORMAL HIGH (ref 15–41)
Albumin: 2.4 g/dL — ABNORMAL LOW (ref 3.5–5.0)
Anion gap: 7 (ref 5–15)
BUN: 11 mg/dL (ref 8–23)
CALCIUM: 7.7 mg/dL — AB (ref 8.9–10.3)
CO2: 26 mmol/L (ref 22–32)
CREATININE: 0.58 mg/dL (ref 0.44–1.00)
Chloride: 105 mmol/L (ref 98–111)
GFR calc Af Amer: >60 mL/min (ref >60)
Glucose, Bld: 122 mg/dL — ABNORMAL HIGH (ref 70–99)
Potassium: 3.7 mmol/L (ref 3.5–5.1)
Sodium: 138 mmol/L (ref 135–145)
Total Bilirubin: 0.7 mg/dL (ref 0.3–1.2)
Total Protein: 5.4 g/dL — ABNORMAL LOW (ref 6.5–8.1)

## 2018-03-08 MED ORDER — LOSARTAN POTASSIUM 50 MG PO TABS: 25.0000 mg | ORAL_TABLET | Freq: Every day | ORAL | Status: DC | PRN

## 2018-03-08 MED ORDER — CEPHALEXIN 250 MG PO CAPS
250.0000 mg | ORAL_CAPSULE | Freq: Four times a day (QID) | ORAL | Status: DC
  Administered 2018-03-08 – 2018-03-09 (×5): 250 mg via ORAL
  Filled 2018-03-08 (×5): qty 1

## 2018-03-08 MED ORDER — ALPRAZOLAM 0.25 MG PO TABS
0.1250 mg | ORAL_TABLET | Freq: Every evening | ORAL | Status: DC | PRN
Start: 2018-03-08 — End: 2018-03-09
  Administered 2018-03-08 (×2): 0.125 mg via ORAL
  Filled 2018-03-08 (×2): qty 1

## 2018-03-08 MED ORDER — ENOXAPARIN SODIUM 40 MG/0.4ML ~~LOC~~ SOLN
40.0000 mg | SUBCUTANEOUS | Status: DC
  Administered 2018-03-09: 40 mg via SUBCUTANEOUS
  Filled 2018-03-08: qty 0.4

## 2018-03-08 MED ORDER — HYDROCODONE-ACETAMINOPHEN 5-325 MG PO TABS
1.0000 | ORAL_TABLET | ORAL | Status: DC | PRN
  Administered 2018-03-08 (×2): 1 via ORAL
  Filled 2018-03-08 (×2): qty 1

## 2018-03-08 MED ORDER — SENNOSIDES-DOCUSATE SODIUM 8.6-50 MG PO TABS: 1.0000 | ORAL_TABLET | Freq: Every evening | ORAL | Status: DC | PRN

## 2018-03-08 MED ORDER — DICLOFENAC SODIUM 1 % TD GEL
2.0000 g | Freq: Four times a day (QID) | TRANSDERMAL | Status: DC
  Administered 2018-03-08 – 2018-03-09 (×6): 2 g via TOPICAL
  Filled 2018-03-08: qty 100

## 2018-03-08 MED ORDER — DOCUSATE SODIUM 100 MG PO CAPS
100.0000 mg | ORAL_CAPSULE | Freq: Two times a day (BID) | ORAL | Status: DC
  Administered 2018-03-08 – 2018-03-09 (×3): 100 mg via ORAL
  Filled 2018-03-08 (×3): qty 1

## 2018-03-08 NOTE — Evaluation (Signed)
Physical Therapy Evaluation Patient Details Name: Chelsea Pratt MRN: [id number] DOB: [birth year] Today's Date: 03/08/2018   History of Present Illness  Chelsea Pratt is a 84 y.o. female with medical history significant of hypertension, stroke, recent right hip hemiarthroplasty on 03/05/2018 by Dr. Aline Brochure, who was discharged earlier today to a rehab facility, but is returning today due to fever. She had a temperature of around 101F and some hypoxemia with pulse oximetry of 81-82% that occurred after arrival to her SNF. She was admitted with concern for UTI and started on Cefepime.    Clinical Impression   Pt admitted with above diagnosis. Pt currently with functional limitations due to the deficits listed below (see PT Problem List). Patient readmitted to acute venue with fever after rt hemi-hip replacement. Patient continues to require min to mod assist for bed mobility, transfers and ambulation short distances with a RW. Pt will benefit from skilled PT to increase their independence and safety with mobility to allow discharge to the venue listed below.       Follow Up Recommendations SNF;Supervision/Assistance - 24 hour    Equipment Recommendations  None recommended by PT    Recommendations for Other Services       Precautions / Restrictions Precautions Precautions: Fall Restrictions Weight Bearing Restrictions: Yes RLE Weight Bearing: Weight bearing as tolerated      Mobility  Bed Mobility Overal bed mobility: Needs Assistance Bed Mobility: Supine to Sit     Supine to sit: Min assist     General bed mobility comments: slow bed mobility, min A with Rt LE supine to sit  Transfers Overall transfer level: Needs assistance Equipment used: Rolling walker (2 wheeled) Transfers: Sit to/from [facility] Sit to Stand: Mod assist Stand pivot transfers: Mod assist       General transfer comment: slow labored movement  Ambulation/Gait Ambulation/Gait assistance:  Max assist;Mod assist Gait Distance (Feet): 5 Feet Assistive device: Rolling walker (2 wheeled) Gait Pattern/deviations: Decreased step length - right;Decreased step length - left;Decreased stance time - right;Decreased stride length Gait velocity: slow   General Gait Details: limited to 5-6 slow labored steps with difficulty advancing RLE due to pain/weakness  Stairs            Wheelchair Mobility    Modified Rankin (Stroke Patients Only)       Balance Overall balance assessment: Needs assistance Sitting-balance support: Feet supported;No upper extremity supported Sitting balance-Leahy Scale: Fair     Standing balance support: Bilateral upper extremity supported;During functional activity Standing balance-Leahy Scale: Poor Standing balance comment: using RW                             Pertinent Vitals/Pain Pain Assessment: Faces Faces Pain Scale: Hurts even more Pain Location: right hip Pain Descriptors / Indicators: Grimacing;Guarding;Sharp;Sore Pain Intervention(s): Limited activity within patient's tolerance;Monitored during session;Repositioned    Home Living Family/patient expects to be discharged to:: Private residence Living Arrangements: Spouse/significant other Available Help at Discharge: Family;Personal care attendant Type of Home: House Home Access: Stairs to enter   [employer] of Steps: 1 Home Layout: One level Home Equipment: Cane - single point;Walker - 2 wheels      Prior Function Level of Independence: Independent with assistive device(s)         Comments: community ambulator with [facility]     Hand Dominance        Extremity/Trunk Assessment  Lower Extremity Assessment Lower Extremity Assessment: Generalized weakness;RLE deficits/detail;LLE deficits/detail RLE Deficits / Details: grossly -3/5 LLE Deficits / Details: grossly 4/5       Communication   Communication: No difficulties  Cognition  Arousal/Alertness: Awake/alert Behavior During Therapy: WFL for tasks assessed/performed Overall Cognitive Status: Within Functional Limits for tasks assessed                                        General Comments      Exercises     Assessment/Plan    PT Assessment Patient needs continued PT services  PT Problem List Decreased strength;Decreased activity tolerance;Decreased balance;Decreased mobility       PT Treatment Interventions Gait training;Functional mobility training;Therapeutic activities;Therapeutic exercise;Patient/family education;DME instruction    PT Goals (Current goals can be found in the Care Plan section)  Acute Rehab PT Goals Patient Stated Goal: return home PT Goal Formulation: With patient/family Time For Goal Achievement: 03/22/18 Potential to Achieve Goals: Good    Frequency Min 5X/week   Barriers to discharge        Co-evaluation               AM-PAC PT "6 Clicks" Daily Activity  Outcome Measure Difficulty turning over in bed (including adjusting bedclothes, sheets and blankets)?: A Lot Difficulty moving from lying on back to sitting on the side of the bed? : A Lot Difficulty sitting down on and standing up from a chair with arms (e.g., wheelchair, bedside commode, etc,.)?: A Lot Help needed moving to and from a bed to chair (including a wheelchair)?: A Lot Help needed walking in hospital room?: Total Help needed climbing 3-5 steps with a railing? : Total 6 Click Score: 10    End of Session Equipment Utilized During Treatment: Gait belt Activity Tolerance: Patient tolerated treatment well;Patient limited by pain;Patient limited by fatigue Patient left: in chair;with call bell/phone within reach;with chair alarm set;with family/visitor present Nurse Communication: Mobility status;Other (comment)(RN aware pt in chair) PT Visit Diagnosis: Unsteadiness on feet (R26.81);Other abnormalities of gait and mobility  (R26.89);Muscle weakness (generalized) (M62.81)    Time: 1510-1540 PT Time Calculation (min) (ACUTE ONLY): 30 min   Charges:   PT Evaluation $PT Eval Low Complexity: 1 Low PT Treatments $Therapeutic Activity: 8-22 mins   PT G Codes:        Early Ord D. Hartnett-Rands, MS, PT Per [place] #[id number] 03/08/2018, 3:53 PM

## 2018-03-08 NOTE — Plan of Care (Signed)
  Problem: Acute Rehab PT Goals(only PT should resolve) Goal: Pt will Roll Supine to Side Outcome: Progressing Flowsheets (Taken 03/08/2018 1555) Pt will Roll Supine to Side: with min assist Goal: Pt Will Go Supine/Side To Sit Outcome: Progressing Flowsheets (Taken 03/08/2018 1555) Pt will go Supine/Side to Sit: with minimal assist Goal: Pt Will Go Sit To Supine/Side Outcome: Progressing Flowsheets (Taken 03/08/2018 1555) Pt will go Sit to Supine/Side: with minimal assist Goal: Patient Will Transfer Sit To/From Stand Outcome: Progressing Flowsheets (Taken 03/08/2018 1555) Patient will transfer sit to/from stand: with minimal assist Goal: Pt Will Transfer Bed To Chair/Chair To Bed Outcome: Progressing Flowsheets (Taken 03/08/2018 1555) Pt will Transfer Bed to Chair/Chair to Bed: with min assist Goal: Pt Will Ambulate Outcome: Progressing Flowsheets (Taken 03/08/2018 1555) Pt will Ambulate: 15 feet;with least restrictive assistive device;with minimal assist   Pamala Hurry D. Hartnett-Rands, MS, PT Per [place] [phone]

## 2018-03-08 NOTE — Progress Notes (Signed)
PROGRESS NOTE    Chelsea Pratt  OFB:[id number] DOB: 17-Aug-1930 DOA: 03/07/2018 PCP: Celene Squibb, MD   Brief Narrative:   Chelsea Pratt is a 84 y.o. female with medical history significant of hypertension, stroke, recent right hip hemiarthroplasty on 03/05/2018 by Dr. Aline Brochure, who was discharged earlier today to a rehab facility, but is returning today due to fever. She had a temperature of around 101F and some hypoxemia with pulse oximetry of 81-82% that occurred after arrival to her SNF. She was admitted with concern for UTI and started on Cefepime.  Assessment & Plan:   Principal Problem:   Sepsis secondary to UTI [facility]) Active Problems:   Closed right hip fracture (HCC)   HTN (hypertension)   Anemia   Hypoalbuminemia   1. Fever and hypoxemia. In setting of recent THA. This appears to be related to atelectasis particularly to LLL base. Up to chair with Incentive spirometry every hour and wean O2 as tolerated.  2. R antecubital fossa cellulitis; mild. Start on oral Keflex and DC Cefepime. Will mark area of erythema so this could be followed. 3. Mild pyuria. No sign of UTI and pt has vulvodynia with chronic vaginal burning which complicates things. 4. Closed R hip fracture s/p THA POD #3. Continue on full dose ASA with follow up to orthopedics as scheduled in the near future. Back to rehab on DC. 5. HTN. Continue Losartan 25mg  po daily. 6. Anemia-stable. Related to post-op blood loss with no overt bleeding noted.   DVT prophylaxis: Lovenox Code Status: Full Family Communication: Husband and home nurse at bedside Disposition Plan: Plan for DC back to SNF likely in 24-48 hours once oxygen weaned and no fever noted.   Consultants:   None  Procedures:   None  Antimicrobials:   Cefepime 7/4-7/5  Keflex 7/5->   Subjective: Patient seen and evaluated today with no new acute complaints or concerns. No acute concerns or events noted overnight. She is intermittently confused,  but this is normal for her.  Objective: Vitals:   03/08/18 0030 03/08/18 0111 03/08/18 0439 03/08/18 0634  BP: (!) 174/66 (!) 126/46  (!) 126/53  Pulse: 100 93  77  Resp: 20     Temp:  98.8 F (37.1 C)  98 F (36.7 C)  TempSrc:  Oral  Oral  SpO2: 99% 93% 100% 100%  Weight:      Height:        Intake/Output Summary (Last 24 hours) at 03/08/2018 1130 Last data filed at 03/08/2018 0800 Gross per 24 hour  Intake 2694.34 ml  Output 200 ml  Net 2494.34 ml   Filed Weights   03/07/18 2026  Weight: 71.2 kg (157 lb)    Examination:  General exam: Appears calm and comfortable  Respiratory system: Clear to auscultation. Respiratory effort normal. On 2L [place]. Cardiovascular system: S1 & S2 heard, RRR. No JVD, murmurs, rubs, gallops or clicks. No pedal edema. Gastrointestinal system: Abdomen is nondistended, soft and nontender. No organomegaly or masses felt. Normal bowel sounds heard. Central nervous system: Alert and oriented. No focal neurological deficits. Skin: R AC fossa of arm with mild erythema and warmth, nontender; small area of purulence.   Data Reviewed: I have personally reviewed following labs and imaging studies  CBC: Recent Labs  Lab 03/04/18 1158 03/05/18 0600 03/06/18 0607 03/07/18 0555 03/07/18 2047 03/08/18 0609  WBC 10.1 10.7* 8.2 10.4 10.4 9.1  NEUTROABS 8.4*  --   --   --  7.2 6.8  HGB 13.2 12.7 10.2* 10.4* 9.2* 9.1*  HCT 40.6 38.3 31.0* 32.4* 27.9* 28.3*  MCV 91.9 90.3 92.0 92.8 91.2 92.2  PLT 223 222 168 170 169 371   Basic Metabolic Panel: Recent Labs  Lab 03/05/18 0600 03/06/18 0607 03/07/18 0555 03/07/18 2047 03/08/18 0609  NA 135 134* 135 132* 138  K 3.6 3.7 3.7 3.6 3.7  CL 101 102 101 99 105  CO2 26 26 28 27 26   GLUCOSE 135* 139* 121* 146* 122*  BUN 16 13 10 11 11   CREATININE 0.80 0.81 0.65 0.78 0.58  CALCIUM 8.6* 7.7* 8.2* 7.9* 7.7*   GFR: Estimated Creatinine Clearance: 50 mL/min (by C-G formula based on SCr of 0.58 mg/dL). Liver  Function Tests: Recent Labs  Lab 03/07/18 2047 03/08/18 0609  AST 41 42*  ALT 16 16  ALKPHOS 54 54  BILITOT 0.7 0.7  PROT 5.4* 5.4*  ALBUMIN 2.6* 2.4*   No results for input(s): LIPASE, AMYLASE in the last 168 hours. No results for input(s): AMMONIA in the last 168 hours. Coagulation Profile: Recent Labs  Lab 03/04/18 1158  INR 0.92   Cardiac Enzymes: No results for input(s): CKTOTAL, CKMB, CKMBINDEX, TROPONINI in the last 168 hours. BNP (last 3 results) No results for input(s): PROBNP in the last 8760 hours. HbA1C: No results for input(s): HGBA1C in the last 72 hours. CBG: Recent Labs  Lab 03/04/18 2133 03/05/18 0514 03/05/18 0742 03/05/18 1631  GLUCAP 141* 136* 113* 114*   Lipid Profile: No results for input(s): CHOL, HDL, LDLCALC, TRIG, CHOLHDL, LDLDIRECT in the last 72 hours. Thyroid Function Tests: No results for input(s): TSH, T4TOTAL, FREET4, T3FREE, THYROIDAB in the last 72 hours. Anemia Panel: No results for input(s): VITAMINB12, FOLATE, FERRITIN, TIBC, IRON, RETICCTPCT in the last 72 hours. Sepsis Labs: Recent Labs  Lab 03/07/18 2047 03/07/18 2059  PROCALCITON 0.18  --   LATICACIDVEN 0.9 0.70    Recent Results (from the past 240 hour(s))  Surgical pcr screen     Status: None   Collection Time: 03/04/18 11:37 PM  Result Value Ref Range Status   MRSA, PCR NEGATIVE NEGATIVE Final   Staphylococcus aureus NEGATIVE NEGATIVE Final    Comment: (NOTE) The Xpert SA Assay (FDA approved for NASAL specimens in patients 43 years of age and older), is one component of a comprehensive surveillance program. It is not intended to diagnose infection nor to guide or monitor treatment. Performed at [facility], [address], [place], [place] [address]   Blood Culture (routine x 2)     Status: None (Preliminary result)   Collection Time: 03/07/18  8:49 PM  Result Value Ref Range Status   Specimen Description BLOOD LEFT ARM  Final   Special Requests   Final     BOTTLES DRAWN AEROBIC AND ANAEROBIC Blood Culture adequate volume   Culture   Final    NO GROWTH < 12 HOURS Performed at [facility], [address], [place], [place] [address]    Report Status PENDING  Incomplete  Blood Culture (routine x 2)     Status: None (Preliminary result)   Collection Time: 03/07/18  8:50 PM  Result Value Ref Range Status   Specimen Description BLOOD RIGHT ARM  Final   Special Requests   Final    BOTTLES DRAWN AEROBIC AND ANAEROBIC Blood Culture adequate volume   Culture   Final    NO GROWTH < 12 HOURS Performed at [facility], [address], [place], [place] [address]  Report Status PENDING  Incomplete         Radiology Studies: Dg Chest Portable 1 View  Result Date: 03/07/2018 CLINICAL DATA:  Code sepsis.  Fever. EXAM: PORTABLE CHEST 1 VIEW COMPARISON:  03/04/2018 and prior radiographs FINDINGS: The cardiomediastinal silhouette is unremarkable. Mild LEFT basilar opacity noted and may represent atelectasis or airspace disease. There is no evidence of pulmonary edema, suspicious pulmonary nodule/mass, pleural effusion, or pneumothorax. No acute bony abnormalities are identified. IMPRESSION: Mild LEFT basilar opacity - slightly favor atelectasis over airspace disease. Electronically Signed   By: Margarette Canada M.D.   On: 03/07/2018 20:54        Scheduled Meds: . cephALEXin  250 mg Oral Q6H  . diclofenac sodium  2-4 g Topical QID  . docusate sodium  100 mg Oral BID  . [START ON 03/09/2018] enoxaparin (LOVENOX) injection  40 mg Subcutaneous Q24H   Continuous Infusions:   LOS: 1 day    Time spent: 30 minutes    Pratik Darleen Crocker, DO [facility] Pager [phone]  If 7PM-7AM, please contact night-coverage [email or web address] Password [email or web address] 03/08/2018, 11:30 AM

## 2018-03-08 NOTE — Clinical Social Work Note (Addendum)
Pt is an 84 year old female re-admitted from [facility] after being discharged there yesterday for rehab following a hip fracture. CSW Assessment from that admission is copied below.  MD indicated pt may be stable for dc back to [facility] tomorrow. Spoke with DON, Abigail Butts, at [facility] today to update.  Per Abigail Butts, they can accept pt back tomorrow. Weekend CSW will be available to assist with dc as needed. Jamie at [facility] can be contacted regarding the dc.   Clinical Social Work Assessment  Patient Details  Name: Chelsea Pratt MRN: [id number] Date of Birth: [birth year]  Date of referral:  03/06/18               Reason for consult:  Facility Placement, Discharge Planning                    Permission sought to share information with:  Chartered certified accountant granted to share information::  Yes, Verbal Permission Granted             Name::                   Agency::  [facility]             Relationship::                Contact Information:     Housing/Transportation Living arrangements for the past 2 months:  Single Family Home Source of Information:  Spouse Patient Interpreter Needed:  None Criminal Activity/Legal Involvement Pertinent to Current Situation/Hospitalization:  No - Comment as needed Significant Relationships:  Spouse Lives with:  Spouse Do you feel safe going back to the place where you live?  Yes Need for family participation in patient care:  No (Coment)  Care giving concerns:  PT recommending SNF rehab.   Social Worker assessment / plan: Pt is an 84 year old female referred to CSW for SNF rehab placement. Met with pt and her husband this AM to assess. Pt lives with her husband in their [place] home. Pt was mostly independent in ADLs prior to admission. Plan is for return to home after rehab. Pt and husband request referral to [facility]. Will start referral and PASRR screen.  Employment status:  Retired Forensic scientist:  Medicare PT Recommendations:  [place] / Referral to community resources:  [place]  Patient/Family's Response to care: Pt and family accepting of care.  Patient/Family's Understanding of and Emotional Response to Diagnosis, Current Treatment, and Prognosis: Pt and her husband appear to have a good understanding of diagnosis and treatment recommendations. No emotional distress identified.  Emotional Assessment Appearance:  Appears stated age Attitude/Demeanor/Rapport:  Lethargic Affect (typically observed):  Calm Orientation:  Oriented to Self, Oriented to Place, Oriented to Situation Alcohol / Substance use:  Not Applicable Psych involvement (Current and /or in the community):  No (Comment)  Discharge Needs  Concerns to be addressed:  Discharge Planning Concerns Readmission within the last 30 days:  No Current discharge risk:  Physical Impairment Barriers to Discharge:  No Barriers Identified   Shade Flood, LCSW 03/06/2018, 11:32 AM

## 2018-03-09 ENCOUNTER — Inpatient Hospital Stay
Admission: RE | Admit: 2018-03-09 | Discharge: 2018-04-30 | Disposition: A | Discharge status: Skilled Nursing Facility | Payer: Medicare Other | Source: Ambulatory Visit | Attending: Internal Medicine | Admitting: Internal Medicine

## 2018-03-09 DIAGNOSIS — N390 Urinary tract infection, site not specified: Secondary | ICD-10-CM | POA: Diagnosis not present

## 2018-03-09 DIAGNOSIS — S72001S Fracture of unspecified part of neck of right femur, sequela: Secondary | ICD-10-CM | POA: Diagnosis not present

## 2018-03-09 DIAGNOSIS — A419 Sepsis, unspecified organism: Secondary | ICD-10-CM | POA: Diagnosis not present

## 2018-03-09 DIAGNOSIS — I69398 Other sequelae of cerebral infarction: Secondary | ICD-10-CM | POA: Diagnosis not present

## 2018-03-09 DIAGNOSIS — I10 Essential (primary) hypertension: Secondary | ICD-10-CM

## 2018-03-09 DIAGNOSIS — E876 Hypokalemia: Secondary | ICD-10-CM | POA: Diagnosis not present

## 2018-03-09 DIAGNOSIS — R509 Fever, unspecified: Secondary | ICD-10-CM | POA: Diagnosis not present

## 2018-03-09 DIAGNOSIS — E8809 Other disorders of plasma-protein metabolism, not elsewhere classified: Secondary | ICD-10-CM

## 2018-03-09 DIAGNOSIS — D649 Anemia, unspecified: Secondary | ICD-10-CM | POA: Diagnosis not present

## 2018-03-09 DIAGNOSIS — M1990 Unspecified osteoarthritis, unspecified site: Secondary | ICD-10-CM | POA: Diagnosis not present

## 2018-03-09 DIAGNOSIS — Z471 Aftercare following joint replacement surgery: Secondary | ICD-10-CM | POA: Diagnosis not present

## 2018-03-09 DIAGNOSIS — R41841 Cognitive communication deficit: Secondary | ICD-10-CM | POA: Diagnosis not present

## 2018-03-09 DIAGNOSIS — Z96641 Presence of right artificial hip joint: Secondary | ICD-10-CM | POA: Diagnosis not present

## 2018-03-09 DIAGNOSIS — R262 Difficulty in walking, not elsewhere classified: Secondary | ICD-10-CM | POA: Diagnosis not present

## 2018-03-09 DIAGNOSIS — M6281 Muscle weakness (generalized): Secondary | ICD-10-CM | POA: Diagnosis not present

## 2018-03-09 DIAGNOSIS — S72011D Unspecified intracapsular fracture of right femur, subsequent encounter for closed fracture with routine healing: Secondary | ICD-10-CM | POA: Diagnosis not present

## 2018-03-09 DIAGNOSIS — Z9181 History of falling: Secondary | ICD-10-CM | POA: Diagnosis not present

## 2018-03-09 LAB — CBC WITH DIFFERENTIAL/PLATELET
BASOS ABS: 0 10*3/uL (ref 0.0–0.1)
Basophils Relative: 1 %
EOS ABS: 0.3 10*3/uL (ref 0.0–0.7)
EOS PCT: 4 %
HCT: 27.5 % — ABNORMAL LOW (ref 36.0–46.0)
Hemoglobin: 9 g/dL — ABNORMAL LOW (ref 12.0–15.0)
Lymphocytes Relative: 17 %
Lymphs Abs: 1.1 10*3/uL (ref 0.7–4.0)
MCH: 30 pg (ref 26.0–34.0)
MCHC: 32.7 g/dL (ref 30.0–36.0)
MCV: 91.7 fL (ref 78.0–100.0)
Monocytes Absolute: 0.9 10*3/uL (ref 0.1–1.0)
Monocytes Relative: 14 %
Neutro Abs: 4.1 10*3/uL (ref 1.7–7.7)
Neutrophils Relative %: 64 %
PLATELETS: 198 10*3/uL (ref 150–400)
RBC: 3 MIL/uL — AB (ref 3.87–5.11)
RDW: 13.4 % (ref 11.5–15.5)
WBC: 6.4 10*3/uL (ref 4.0–10.5)

## 2018-03-09 LAB — TYPE AND SCREEN
ABO/RH(D): A POS
Antibody Screen: NEGATIVE
UNIT DIVISION: 0
Unit division: 0

## 2018-03-09 LAB — BASIC METABOLIC PANEL
Anion gap: 7 (ref 5–15)
BUN: 10 mg/dL (ref 8–23)
CALCIUM: 7.8 mg/dL — AB (ref 8.9–10.3)
CO2: 25 mmol/L (ref 22–32)
CREATININE: 0.58 mg/dL (ref 0.44–1.00)
Chloride: 105 mmol/L (ref 98–111)
GFR calc Af Amer: >60 mL/min (ref >60)
Glucose, Bld: 107 mg/dL — ABNORMAL HIGH (ref 70–99)
Potassium: 4 mmol/L (ref 3.5–5.1)
SODIUM: 137 mmol/L (ref 135–145)

## 2018-03-09 LAB — BPAM RBC
Blood Product Expiration Date: 201907132359
Blood Product Expiration Date: 201907132359
Unit Type and Rh: 6200
Unit Type and Rh: 6200

## 2018-03-09 LAB — URINE CULTURE: CULTURE: NO GROWTH

## 2018-03-09 MED ORDER — CEPHALEXIN 250 MG PO CAPS: 250.0000 mg | ORAL_CAPSULE | Freq: Four times a day (QID) | ORAL | 0 refills | Status: AC

## 2018-03-09 MED ORDER — ALPRAZOLAM 0.25 MG PO TABS: 0.1250 mg | ORAL_TABLET | Freq: Every evening | ORAL | 0 refills | Status: DC | PRN

## 2018-03-09 MED ORDER — HYDROCODONE-ACETAMINOPHEN 5-325 MG PO TABS: 1.0000 | ORAL_TABLET | Freq: Four times a day (QID) | ORAL | 0 refills | Status: DC | PRN

## 2018-03-09 NOTE — Progress Notes (Signed)
Patient will Discharge [facility] Anticipated DC Date:03/09/18 Family Notified:yes, Oswaldo Done at bedside [phone]) Transport By: Merrily Brittle   Per MD patient ready for DC to [facility] . RN, patient, patient's family, and facility notified of DC. Assessment, Fl2/Pasrr, and Discharge Summary sent to facility. RN given number for report [phone], Room # 125). DC packet on chart. Ambulance transport requested for patient.   CSW signing off.  Reed Breech LCSWA [phone]

## 2018-03-09 NOTE — Discharge Summary (Signed)
Physician Discharge Summary  Chelsea Pratt EUM:[id number] DOB: 03-09-1930 DOA: 03/07/2018  PCP: Celene Squibb, MD  Admit date: 03/07/2018 Discharge date: 03/09/2018  Admitted From: Skilled nursing facility Disposition: Skilled nursing facility  Recommendations for Outpatient Follow-up:  1. Return to skilled nursing facility for continued rehab  Discharge Condition: Stable CODE STATUS: Full code Diet recommendation: Heart healthy  Brief/Interim Summary: 84 year old female with a history of hypertension, previous stroke, recent right hip fracture status post surgical repair on 7/2, was discharged from the hospital on 7/4 to rehab facility.  She is brought back to the hospital the same day due to fever.  She was noted to have a temperature 101 and hypoxemia at 81 to 82%.  Indicated atelectasis.  Urinalysis indicated possible infection, but urine culture did not show any growth.  She was noted to have mild cellulitis in her right antecubital fossa.  She was started on intravenous antibiotics.  Respiratory status remained stable.  She did not have any evidence of pneumonia.  She is breathing comfortably on room air.  Overall cellulitis in her right arm has improved.  Cultures have shown no significant growth.  She is otherwise stable at this time.  Plan will be to discharge back to the nursing facility for continued physical therapy.  She is been transitioned to oral antibiotics.  Discharge Diagnoses:  Principal Problem:   Sepsis secondary to UTI [facility]) Active Problems:   Closed right hip fracture ([place])   HTN (hypertension)   Anemia   Hypoalbuminemia    Discharge Instructions  Discharge Instructions    Diet - low sodium heart healthy   Complete by:  As directed    Increase activity slowly   Complete by:  As directed      Allergies as of 03/09/2018   No Known Allergies     Medication List    TAKE these medications   ALPRAZolam 0.25 MG tablet Commonly known as:  XANAX Take 0.125 mg by  mouth at bedtime as needed for anxiety or sleep.   aspirin 325 MG EC tablet Take 1 tablet (325 mg total) by mouth daily with breakfast.   cephALEXin 250 MG capsule Commonly known as:  KEFLEX Take 1 capsule (250 mg total) by mouth every 6 (six) hours for 5 days.   cyclobenzaprine 5 MG tablet Commonly known as:  FLEXERIL Take 1 tablet (5 mg total) by mouth 3 (three) times daily as needed for muscle spasms.   diclofenac sodium 1 % Gel Commonly known as:  VOLTAREN Apply 2-4 g topically 4 (four) times daily.   docusate sodium 100 MG capsule Commonly known as:  COLACE Take 1 capsule (100 mg total) by mouth 2 (two) times daily.   HYDROcodone-acetaminophen 5-325 MG tablet Commonly known as:  NORCO/VICODIN Take 1-2 tablets by mouth every 6 (six) hours as needed for up to 5 days for moderate pain.   losartan 25 MG tablet Commonly known as:  COZAAR Take 25 mg by mouth daily as needed (when blood pressure is over 140).   senna-docusate 8.6-50 MG tablet Commonly known as:  Senokot-S Take 1 tablet by mouth at bedtime as needed for mild constipation.       No Known Allergies  Consultations:     Procedures/Studies: Dg Chest 1 View  Result Date: 03/04/2018 CLINICAL DATA:  Trip and fall with known right hip fracture EXAM: CHEST  1 VIEW COMPARISON:  04/09/2010 FINDINGS: Cardiac shadow is within normal limits. Lungs are well aerated bilaterally. No focal infiltrate  or effusion is seen. No bony abnormality is noted. IMPRESSION: No acute abnormality noted. Electronically Signed   By: Inez Catalina M.D.   On: 03/04/2018 12:53   Dg Pelvis Portable  Result Date: 03/05/2018 CLINICAL DATA:  Post right hip replacement EXAM: PORTABLE PELVIS 1-2 VIEWS COMPARISON:  None. FINDINGS: A portable view of the pelvis shows right total hip replacement components in good position. No complicating features are seen. The pelvic rami are intact. The left hip joint space is within normal limits for age. The SI  joints appear corticated. IMPRESSION: Components of right total hip replacement are in good position with no complicating features. Electronically Signed   By: Ivar Drape M.D.   On: 03/05/2018 12:27   Dg Chest Portable 1 View  Result Date: 03/07/2018 CLINICAL DATA:  Code sepsis.  Fever. EXAM: PORTABLE CHEST 1 VIEW COMPARISON:  03/04/2018 and prior radiographs FINDINGS: The cardiomediastinal silhouette is unremarkable. Mild LEFT basilar opacity noted and may represent atelectasis or airspace disease. There is no evidence of pulmonary edema, suspicious pulmonary nodule/mass, pleural effusion, or pneumothorax. No acute bony abnormalities are identified. IMPRESSION: Mild LEFT basilar opacity - slightly favor atelectasis over airspace disease. Electronically Signed   By: Margarette Canada M.D.   On: 03/07/2018 20:54   Dg Hip Unilat W Or Wo Pelvis 2-3 Views Right  Result Date: 03/04/2018 CLINICAL DATA:  Recent trip and fall with right hip pain, initial encounter EXAM: DG HIP (WITH OR WITHOUT PELVIS) 3V RIGHT COMPARISON:  None. FINDINGS: Pelvic ring is intact. Subcapital femoral neck fracture is noted with impaction and angulation at the fracture site. No other fractures are seen. No soft tissue changes are noted. IMPRESSION: Subcapital right femoral neck fracture. Electronically Signed   By: Inez Catalina M.D.   On: 03/04/2018 12:48       Subjective: No shortness of breath, cough.  No pain.  Discharge Exam: Vitals:   03/08/18 2117 03/09/18 0535  BP: 121/62 (!) 130/49  Pulse: 75 75  Resp: 18 18  Temp: 98.8 F (37.1 C) 98.4 F (36.9 C)  SpO2: 97% 95%   Vitals:   03/08/18 1226 03/08/18 1452 03/08/18 2117 03/09/18 0535  BP:  (!) 124/57 121/62 (!) 130/49  Pulse:  81 75 75  Resp:  18 18 18   Temp:  98.2 F (36.8 C) 98.8 F (37.1 C) 98.4 F (36.9 C)  TempSrc:  Oral Oral Oral  SpO2: 98% 96% 97% 95%  Weight:      Height:        General: Pt is alert, awake, not in acute distress Cardiovascular:  RRR, S1/S2 +, no rubs, no gallops Respiratory: CTA bilaterally, no wheezing, no rhonchi Abdominal: Soft, NT, ND, bowel sounds + Extremities: Erythema and right arm is improving    The results of significant diagnostics from this hospitalization (including imaging, microbiology, ancillary and laboratory) are listed below for reference.     Microbiology: Recent Results (from the past 240 hour(s))  Surgical pcr screen     Status: None   Collection Time: 03/04/18 11:37 PM  Result Value Ref Range Status   MRSA, PCR NEGATIVE NEGATIVE Final   Staphylococcus aureus NEGATIVE NEGATIVE Final    Comment: (NOTE) The Xpert SA Assay (FDA approved for NASAL specimens in patients 38 years of age and older), is one component of a comprehensive surveillance program. It is not intended to diagnose infection nor to guide or monitor treatment. Performed at [facility], [address], [place], [place] [address]  Blood Culture (routine x 2)     Status: None (Preliminary result)   Collection Time: 03/07/18  8:49 PM  Result Value Ref Range Status   Specimen Description BLOOD LEFT ARM  Final   Special Requests   Final    BOTTLES DRAWN AEROBIC AND ANAEROBIC Blood Culture adequate volume   Culture   Final    NO GROWTH 2 DAYS Performed at [facility], [address], [place], [place] [address]    Report Status PENDING  Incomplete  Blood Culture (routine x 2)     Status: None (Preliminary result)   Collection Time: 03/07/18  8:50 PM  Result Value Ref Range Status   Specimen Description BLOOD RIGHT ARM  Final   Special Requests   Final    BOTTLES DRAWN AEROBIC AND ANAEROBIC Blood Culture adequate volume   Culture   Final    NO GROWTH 2 DAYS Performed at [facility], [address] [place], [place] [address]    Report Status PENDING  Incomplete  Urine Culture     Status: None   Collection Time: 03/07/18 10:00 PM  Result Value Ref Range Status   Specimen Description   Final    URINE, CLEAN  CATCH Performed at [facility], [address], [place], [place] [address]    Special Requests   Final    NONE Performed at [facility], [address], [place], [place] [address]    Culture   Final    NO GROWTH Performed at [facility], [place] [address] [place], [place] [address]    Report Status 03/09/2018 FINAL  Final     Labs: BNP (last 3 results) No results for input(s): BNP in the last 8760 hours. Basic Metabolic Panel: Recent Labs  Lab 03/06/18 0607 03/07/18 0555 03/07/18 2047 03/08/18 0609 03/09/18 0657  NA 134* 135 132* 138 137  K 3.7 3.7 3.6 3.7 4.0  CL 102 101 99 105 105  CO2 26 28 27 26 25   GLUCOSE 139* 121* 146* 122* 107*  BUN 13 10 11 11 10   CREATININE 0.81 0.65 0.78 0.58 0.58  CALCIUM 7.7* 8.2* 7.9* 7.7* 7.8*   Liver Function Tests: Recent Labs  Lab 03/07/18 2047 03/08/18 0609  AST 41 42*  ALT 16 16  ALKPHOS 54 54  BILITOT 0.7 0.7  PROT 5.4* 5.4*  ALBUMIN 2.6* 2.4*   No results for input(s): LIPASE, AMYLASE in the last 168 hours. No results for input(s): AMMONIA in the last 168 hours. CBC: Recent Labs  Lab 03/04/18 1158  03/06/18 0607 03/07/18 0555 03/07/18 2047 03/08/18 0609 03/09/18 0857  WBC 10.1   < > 8.2 10.4 10.4 9.1 6.4  NEUTROABS 8.4*  --   --   --  7.2 6.8 4.1  HGB 13.2   < > 10.2* 10.4* 9.2* 9.1* 9.0*  HCT 40.6   < > 31.0* 32.4* 27.9* 28.3* 27.5*  MCV 91.9   < > 92.0 92.8 91.2 92.2 91.7  PLT 223   < > 168 170 169 187 198   < > = values in this interval not displayed.   Cardiac Enzymes: No results for input(s): CKTOTAL, CKMB, CKMBINDEX, TROPONINI in the last 168 hours. BNP: Invalid input(s): POCBNP CBG: Recent Labs  Lab 03/04/18 2133 03/05/18 0514 03/05/18 0742 03/05/18 1631  GLUCAP 141* 136* 113* 114*   D-Dimer No results for input(s): DDIMER in the last 72 hours. Hgb A1c No results for input(s): HGBA1C in the last 72 hours.  Lipid Profile No results for input(s): CHOL, HDL, LDLCALC, TRIG, CHOLHDL,  LDLDIRECT in the last 72 hours. Thyroid function studies No results for input(s): TSH, T4TOTAL, T3FREE, THYROIDAB in the last 72 hours.  Invalid input(s): FREET3 Anemia work up No results for input(s): VITAMINB12, FOLATE, FERRITIN, TIBC, IRON, RETICCTPCT in the last 72 hours. Urinalysis    Component Value Date/Time   COLORURINE YELLOW 03/07/2018 2200   APPEARANCEUR CLEAR 03/07/2018 2200   LABSPEC 1.015 03/07/2018 2200   PHURINE 6.0 03/07/2018 2200   GLUCOSEU NEGATIVE 03/07/2018 2200   HGBUR SMALL (A) 03/07/2018 2200   BILIRUBINUR NEGATIVE 03/07/2018 2200   KETONESUR 20 (A) 03/07/2018 2200   PROTEINUR 30 (A) 03/07/2018 2200   NITRITE NEGATIVE 03/07/2018 2200   LEUKOCYTESUR SMALL (A) 03/07/2018 2200   Sepsis Labs Invalid input(s): PROCALCITONIN,  WBC,  LACTICIDVEN Microbiology Recent Results (from the past 240 hour(s))  Surgical pcr screen     Status: None   Collection Time: 03/04/18 11:37 PM  Result Value Ref Range Status   MRSA, PCR NEGATIVE NEGATIVE Final   Staphylococcus aureus NEGATIVE NEGATIVE Final    Comment: (NOTE) The Xpert SA Assay (FDA approved for NASAL specimens in patients 61 years of age and older), is one component of a comprehensive surveillance program. It is not intended to diagnose infection nor to guide or monitor treatment. Performed at [facility], [address] [place], [place] [address]   Blood Culture (routine x 2)     Status: None (Preliminary result)   Collection Time: 03/07/18  8:49 PM  Result Value Ref Range Status   Specimen Description BLOOD LEFT ARM  Final   Special Requests   Final    BOTTLES DRAWN AEROBIC AND ANAEROBIC Blood Culture adequate volume   Culture   Final    NO GROWTH 2 DAYS Performed at [facility], [address] [place], [place] [address]    Report Status PENDING  Incomplete  Blood Culture (routine x 2)     Status: None (Preliminary result)   Collection Time: 03/07/18  8:50 PM  Result Value Ref Range Status    Specimen Description BLOOD RIGHT ARM  Final   Special Requests   Final    BOTTLES DRAWN AEROBIC AND ANAEROBIC Blood Culture adequate volume   Culture   Final    NO GROWTH 2 DAYS Performed at [facility], [address], [place], [place] [address]    Report Status PENDING  Incomplete  Urine Culture     Status: None   Collection Time: 03/07/18 10:00 PM  Result Value Ref Range Status   Specimen Description   Final    URINE, CLEAN CATCH Performed at [facility], [address] [place], [place] [address]    Special Requests   Final    NONE Performed at [facility], [address] [place], [place] [address]    Culture   Final    NO GROWTH Performed at [facility], [place] [address] [place],  [address]    Report Status 03/09/2018 FINAL  Final     Time coordinating discharge: 53mins  SIGNED:   Kathie Dike, MD  [facility] 03/09/2018, 2:01 PM Pager   If 7PM-7AM, please contact night-coverage [email or web address] Password [email or web address]

## 2018-03-09 NOTE — Progress Notes (Signed)
Patient discharged to [facility] today per MD orders. Patient vital signs WDL. IV removed and site WDL. Discharge Instructions including follow up appointments, medications, and education reviewed with nurse Estill Bamberg at [facility], [place] understanding. Patient is transported out via wheelchair.  Family updated at bedside.

## 2018-03-11 ENCOUNTER — Other Ambulatory Visit: Payer: Self-pay

## 2018-03-11 ENCOUNTER — Non-Acute Institutional Stay (SKILLED_NURSING_FACILITY): Payer: Medicare Other | Admitting: Internal Medicine

## 2018-03-11 ENCOUNTER — Encounter (HOSPITAL_COMMUNITY)
Admission: RE | Admit: 2018-03-11 | Discharge: 2018-03-11 | Disposition: A | Discharge status: Home / Self Care | Payer: Medicare Other | Source: Skilled Nursing Facility | Attending: Internal Medicine | Admitting: Internal Medicine

## 2018-03-11 ENCOUNTER — Encounter: Payer: Self-pay | Admitting: Internal Medicine

## 2018-03-11 DIAGNOSIS — I10 Essential (primary) hypertension: Secondary | ICD-10-CM | POA: Insufficient documentation

## 2018-03-11 DIAGNOSIS — S72001S Fracture of unspecified part of neck of right femur, sequela: Secondary | ICD-10-CM

## 2018-03-11 DIAGNOSIS — R509 Fever, unspecified: Secondary | ICD-10-CM | POA: Diagnosis not present

## 2018-03-11 DIAGNOSIS — D649 Anemia, unspecified: Secondary | ICD-10-CM

## 2018-03-11 DIAGNOSIS — E876 Hypokalemia: Secondary | ICD-10-CM

## 2018-03-11 LAB — BASIC METABOLIC PANEL
Anion gap: 8 (ref 5–15)
BUN: 10 mg/dL (ref 8–23)
CALCIUM: 8.2 mg/dL — AB (ref 8.9–10.3)
CO2: 28 mmol/L (ref 22–32)
Chloride: 101 mmol/L (ref 98–111)
Creatinine, Ser: 0.61 mg/dL (ref 0.44–1.00)
GFR calc non Af Amer: >60 mL/min (ref >60)
Glucose, Bld: 107 mg/dL — ABNORMAL HIGH (ref 70–99)
Potassium: 3.3 mmol/L — ABNORMAL LOW (ref 3.5–5.1)
SODIUM: 137 mmol/L (ref 135–145)

## 2018-03-11 LAB — CBC
HCT: 28.3 % — ABNORMAL LOW (ref 36.0–46.0)
Hemoglobin: 9.1 g/dL — ABNORMAL LOW (ref 12.0–15.0)
MCH: 29.4 pg (ref 26.0–34.0)
MCHC: 32.2 g/dL (ref 30.0–36.0)
MCV: 91.3 fL (ref 78.0–100.0)
Platelets: 271 10*3/uL (ref 150–400)
RBC: 3.1 MIL/uL — ABNORMAL LOW (ref 3.87–5.11)
RDW: 13.3 % (ref 11.5–15.5)
WBC: 5.6 10*3/uL (ref 4.0–10.5)

## 2018-03-11 MED ORDER — HYDROCODONE-ACETAMINOPHEN 5-325 MG PO TABS: 1.0000 | ORAL_TABLET | Freq: Four times a day (QID) | ORAL | 0 refills | Status: DC | PRN

## 2018-03-11 NOTE — Telephone Encounter (Signed)
RX Fax for [facility]@ [phone]  

## 2018-03-11 NOTE — Progress Notes (Signed)
Provider: Veleta Miners  Location:   [place] Room Number: 125/P Place of Service:  SNF (31)  PCP: Celene Squibb, MD Patient Care Team: Celene Squibb, MD as PCP - General (Internal Medicine)  Extended Emergency Contact Information Primary Emergency Contact: Zabawa,John Address: [address]          [place], [place] [address] [place] Phone: [phone] Relation: Spouse  Code Status: Full Code Goals of Care: Advanced Directive information Advanced Directives 03/11/2018  Does Patient Have a Medical Advance Directive? Yes  Type of Advance Directive (No Data)  Does patient want to make changes to medical advance directive? No - Patient declined  Copy of [place] in Chart? No - copy requested  Would patient like information on creating a medical advance directive? No - Patient declined      Chief Complaint  Patient presents with  . New Admit To SNF    New Admission Visit    HPI: Patient is a 84 y.o. female seen today for admission to SNF for therapy after staying in the hospital From 07/01-07/04 For Right Hip fracture underwent Bipolar Replacement of right Hip. On  07/02. She was again admitted on 07/04-07/06 For Fever ? UTI / Cellulitis.  Patient has H/O Hypertensin who came to the ED on 07/01 for Right Hip fracture. After sustaining a mechanical fall at home. She underwent Right hip Surgery on 07/02. She was admitted to the SNF and Resend to the hospital due to Fever and Hypoxemia. In the hospital her urine culture was negative and blood pulses were negative.  There was some questionable right arm cellulitis and she was started on IV antibiotics.  She was transitioned to oral antibiotics and discharged back to the facility for therapy. Patient did not have any complaints today.  She denies any cough, chest pain, shortness of breath.  No fever or chills. Her only complaint was pain in the leg. Patient states she lives with  her husband and was driving before the fall.     Past Medical History:  Diagnosis Date  . HTN (hypertension)   . Stroke [facility])    Past Surgical History:  Procedure Laterality Date  . BRAIN SURGERY    . HIP ARTHROPLASTY Right 03/05/2018   Procedure: ARTHROPLASTY BIPOLAR HIP (HEMIARTHROPLASTY);  Surgeon: Carole Civil, MD;  Location: [facility];  Service: Orthopedics;  Laterality: Right;    reports that she has never smoked. She has never used smokeless tobacco. She reports that she does not drink alcohol or use drugs. Social History   Socioeconomic History  . Marital status: Married    Spouse name: Jenny Reichmann   . Number of children: 0  . Years of education: 71  . Highest education level: Not on file  Occupational History  . Occupation: Medical sales representative  . Occupation: Retired   Scientific laboratory technician  . Financial resource strain: Not on file  . Food insecurity:    Worry: Not on file    Inability: Not on file  . Transportation needs:    Medical: Not on file    Non-medical: Not on file  Tobacco Use  . Smoking status: Never Smoker  . Smokeless tobacco: Never Used  Substance and Sexual Activity  . Alcohol use: No  . Drug use: No  . Sexual activity: Not on file  Lifestyle  . Physical activity:    Days per week: Not on file    Minutes per session: Not on  file  . Stress: Not on file  Relationships  . Social connections:    Talks on phone: Not on file    Gets together: Not on file    Attends religious service: Not on file    Active member of club or organization: Not on file    Attends meetings of clubs or organizations: Not on file    Relationship status: Not on file  . Intimate partner violence:    Fear of current or ex partner: Not on file    Emotionally abused: Not on file    Physically abused: Not on file    Forced sexual activity: Not on file  Other Topics Concern  . Not on file  Social History Narrative   Patient lives at home with husband Jenny Reichmann.    Patient has no children.     Patient is retired.    Patient has a high school education.    Patient is right handed.     Functional Status Survey:    Family History  Problem Relation Age of Onset  . Heart attack Father     Health Maintenance  Topic Date Due  . DEXA SCAN  04/11/2018 (Originally 03/23/1997)  . TETANUS/TDAP  04/11/2018 (Originally 03/24/1949)  . PNA vac Low Risk Adult (1 of 2 - PCV13) 04/11/2018 (Originally 03/23/1997)  . INFLUENZA VACCINE  04/04/2018    No Known Allergies  Allergies as of 03/11/2018   No Known Allergies     Medication List        Accurate as of 03/11/18  2:36 PM. Always use your most recent med list.          ALPRAZolam 0.25 MG tablet Commonly known as:  XANAX Take 0.5 tablets (0.125 mg total) by mouth at bedtime as needed for anxiety or sleep.   aspirin EC 81 MG tablet Take 81 mg by mouth daily. Starting 04/05/2018   aspirin 325 MG EC tablet Take 1 tablet (325 mg total) by mouth daily with breakfast.   cephALEXin 250 MG capsule Commonly known as:  KEFLEX Take 1 capsule (250 mg total) by mouth every 6 (six) hours for 5 days.   cyclobenzaprine 5 MG tablet Commonly known as:  FLEXERIL Take 1 tablet (5 mg total) by mouth 3 (three) times daily as needed for muscle spasms.   docusate sodium 100 MG capsule Commonly known as:  COLACE Take 1 capsule (100 mg total) by mouth 2 (two) times daily.   HYDROcodone-acetaminophen 5-325 MG tablet Commonly known as:  NORCO/VICODIN Take 1-2 tablets by mouth every 6 (six) hours as needed for up to 5 days for moderate pain.   losartan 25 MG tablet Commonly known as:  COZAAR Take 25 mg by mouth daily as needed (when blood pressure is over 140).   senna-docusate 8.6-50 MG tablet Commonly known as:  Senokot-S Take 1 tablet by mouth at bedtime as needed for mild constipation.   VOLTAREN 1 % Gel Generic drug:  diclofenac sodium Apply 4 g topically every 6 (six) hours. Apply to right hip for pain not to exceed 32 grams daily to  all joints       Review of Systems  Review of Systems  Constitutional: Negative for activity change, appetite change, chills, diaphoresis, fatigue and fever.  HENT: Negative for mouth sores, postnasal drip, rhinorrhea, sinus pain and sore throat.   Respiratory: Negative for apnea, cough, chest tightness, shortness of breath and wheezing.   Cardiovascular: Negative for chest pain, palpitations and leg swelling.  Gastrointestinal: Negative for abdominal distention, abdominal pain, constipation, diarrhea, nausea and vomiting.  Genitourinary: Negative for dysuria and frequency.  Musculoskeletal: Negative for arthralgias, joint swelling and myalgias.  Skin: Negative for rash.  Neurological: Negative for dizziness, syncope, weakness, light-headedness and numbness.  Psychiatric/Behavioral: Negative for behavioral problems, confusion and sleep disturbance.     Vitals:   03/11/18 1027  BP: 132/75  Pulse: 79  Resp: (!) 24  Temp: (!) 97.3 F (36.3 C)  TempSrc: Oral   There is no height or weight on file to calculate BMI. Physical Exam  Constitutional: She is oriented to person, place, and time. She appears well-developed and well-nourished.  HENT:  Head: Normocephalic.  Mouth/Throat: Oropharynx is clear and moist.  Eyes: Pupils are equal, round, and reactive to light.  Neck: Neck supple.  Cardiovascular: Normal rate and regular rhythm.  No murmur heard. Pulmonary/Chest: Effort normal and breath sounds normal. No stridor. No respiratory distress. She has no wheezes.  Abdominal: Soft. Bowel sounds are normal. She exhibits no distension. There is no tenderness. There is no guarding.  Musculoskeletal: She exhibits no edema.  Neurological: She is alert and oriented to person, place, and time.  No Focal Deficits. She was oriented but seems confused about lot of issues and will repeat herself.  Skin: Skin is warm and dry.  Psychiatric: She has a normal mood and affect. Her behavior is  normal. Thought content normal.    Labs reviewed: Basic Metabolic Panel: Recent Labs    03/08/18 0609 03/09/18 0657 03/11/18 0810  NA 138 137 137  K 3.7 4.0 3.3*  CL 105 105 101  CO2 26 25 28   GLUCOSE 122* 107* 107*  BUN 11 10 10   CREATININE 0.58 0.58 0.61  CALCIUM 7.7* 7.8* 8.2*   Liver Function Tests: Recent Labs    03/07/18 2047 03/08/18 0609  AST 41 42*  ALT 16 16  ALKPHOS 54 54  BILITOT 0.7 0.7  PROT 5.4* 5.4*  ALBUMIN 2.6* 2.4*   No results for input(s): LIPASE, AMYLASE in the last 8760 hours. No results for input(s): AMMONIA in the last 8760 hours. CBC: Recent Labs    03/07/18 2047 03/08/18 0609 03/09/18 0857 03/11/18 0810  WBC 10.4 9.1 6.4 5.6  NEUTROABS 7.2 6.8 4.1  --   HGB 9.2* 9.1* 9.0* 9.1*  HCT 27.9* 28.3* 27.5* 28.3*  MCV 91.2 92.2 91.7 91.3  PLT 169 187 198 271   Cardiac Enzymes: No results for input(s): CKTOTAL, CKMB, CKMBINDEX, TROPONINI in the last 8760 hours. BNP: Invalid input(s): POCBNP Lab Results  Component Value Date   HGBA1C (H) 04/12/2010    5.9 (NOTE)                                                                       According to the ADA Clinical Practice Recommendations for 2011, when HbA1c is used as a screening test:   >=6.5%   Diagnostic of Diabetes Mellitus           (if abnormal result  is confirmed)  5.7-6.4%   Increased risk of developing Diabetes Mellitus  References:Diagnosis and Classification of Diabetes Mellitus,Diabetes GGYI,9485,46(EVOJJ 1):S62-S69 and Standards of Medical Care in         Diabetes -  2011,Diabetes TIWP,8099,83  (Suppl 1):S11-S61.   No results found for: TSH No results found for: VITAMINB12 No results found for: FOLATE No results found for: IRON, TIBC, FERRITIN  Imaging and Procedures obtained prior to SNF admission: No results found.  Assessment/Plan  Fever It has resolved since she has been on antibiotics. Urine culture was negative and so was her blood culture.  Closed fracture of  right hip S/P hip Replacement She is WBAT For aspirin DVT Pain control also  on muscle relaxant  Essential hypertension Her BP controlled on Losartan.? PRN.  Anemia Post Op. Hgb Stable Continue to monitor  Hypokalemia Potassium Supplemented Dementia Patient does have Cognitive impairement Will Monitor in Facility and See how she does with therapy. Plan for discharge home with her husband    Family/ staff Communication:   Labs/tests ordered: Total time spent in this patient care encounter was 45_ minutes; greater than 50% of the visit spent counseling patient, reviewing records , Labs and coordinating care for problems addressed at this encounter.

## 2018-03-12 LAB — CULTURE, BLOOD (ROUTINE X 2)
Culture: NO GROWTH
Culture: NO GROWTH
SPECIAL REQUESTS: ADEQUATE
Special Requests: ADEQUATE

## 2018-03-15 ENCOUNTER — Other Ambulatory Visit: Payer: Self-pay

## 2018-03-15 MED ORDER — HYDROCODONE-ACETAMINOPHEN 5-325 MG PO TABS: 1.0000 | ORAL_TABLET | Freq: Four times a day (QID) | ORAL | 0 refills | Status: AC | PRN

## 2018-03-15 NOTE — Telephone Encounter (Signed)
RX Fax for [facility]@ [phone]  

## 2018-03-18 ENCOUNTER — Encounter (HOSPITAL_COMMUNITY)
Admission: RE | Admit: 2018-03-18 | Discharge: 2018-03-18 | Disposition: A | Discharge status: Home / Self Care | Payer: Medicare Other | Source: Skilled Nursing Facility | Attending: *Deleted | Admitting: *Deleted

## 2018-03-18 LAB — CBC
HCT: 32 % — ABNORMAL LOW (ref 36.0–46.0)
Hemoglobin: 10.1 g/dL — ABNORMAL LOW (ref 12.0–15.0)
MCH: 29.5 pg (ref 26.0–34.0)
MCHC: 31.6 g/dL (ref 30.0–36.0)
MCV: 93.6 fL (ref 78.0–100.0)
PLATELETS: 450 10*3/uL — AB (ref 150–400)
RBC: 3.42 MIL/uL — ABNORMAL LOW (ref 3.87–5.11)
RDW: 14.3 % (ref 11.5–15.5)
WBC: 7.3 10*3/uL (ref 4.0–10.5)

## 2018-03-18 LAB — BASIC METABOLIC PANEL
ANION GAP: 7 (ref 5–15)
BUN: 11 mg/dL (ref 8–23)
CALCIUM: 8.8 mg/dL — AB (ref 8.9–10.3)
CO2: 29 mmol/L (ref 22–32)
Chloride: 104 mmol/L (ref 98–111)
Creatinine, Ser: 0.75 mg/dL (ref 0.44–1.00)
GFR calc Af Amer: >60 mL/min (ref >60)
Glucose, Bld: 100 mg/dL — ABNORMAL HIGH (ref 70–99)
Potassium: 4.5 mmol/L (ref 3.5–5.1)
Sodium: 140 mmol/L (ref 135–145)

## 2018-03-22 ENCOUNTER — Encounter: Payer: Self-pay | Admitting: Orthopedic Surgery

## 2018-03-22 ENCOUNTER — Ambulatory Visit (INDEPENDENT_AMBULATORY_CARE_PROVIDER_SITE_OTHER): Payer: Medicare Other | Admitting: Orthopedic Surgery

## 2018-03-22 DIAGNOSIS — Z96641 Presence of right artificial hip joint: Secondary | ICD-10-CM | POA: Diagnosis not present

## 2018-03-22 NOTE — Progress Notes (Signed)
Encounter Diagnosis  Name Primary?  . S/P hip replacement, right bipolar replacement due to frature 03/05/18    Chief Complaint  Patient presents with  . Post-op Follow-up    right hip bipolar 03/05/18    Postop day 17 right hip fracture treated with a bipolar replacement.  She is doing very well she got up out of the chair with minimal assistance her leg lengths are equal her hip incision healed well she has good hip flexion independently  Follow-up in 6 weeks  Continue therapy  Release to home when appropriate

## 2018-03-27 ENCOUNTER — Other Ambulatory Visit: Payer: Self-pay | Admitting: *Deleted

## 2018-03-27 NOTE — Patient Outreach (Signed)
[facility]) Care Management  03/27/2018  SUEKO DIMICHELE 04-29-1930 [id number]   Onsite visit with Stephens November, SW at facility.  She reports that patient did get notice of last covered day but patient spouse has decided to keep  Patient at the facility private pay.  Plan is to go to [facility] at a future time, but undecided at this time.   No [facility] community CM needs identified at this time.  Plan to close. [name]. [name], RN, BSN, [place] [phone]) Business Cell  [phone]) Toll Free Office

## 2018-03-29 ENCOUNTER — Encounter: Payer: Self-pay | Admitting: Internal Medicine

## 2018-03-29 ENCOUNTER — Non-Acute Institutional Stay (SKILLED_NURSING_FACILITY): Payer: Medicare Other | Admitting: Internal Medicine

## 2018-03-29 DIAGNOSIS — E876 Hypokalemia: Secondary | ICD-10-CM

## 2018-03-29 DIAGNOSIS — D649 Anemia, unspecified: Secondary | ICD-10-CM

## 2018-03-29 DIAGNOSIS — I10 Essential (primary) hypertension: Secondary | ICD-10-CM | POA: Diagnosis not present

## 2018-03-29 DIAGNOSIS — Z96641 Presence of right artificial hip joint: Secondary | ICD-10-CM | POA: Diagnosis not present

## 2018-03-29 DIAGNOSIS — S72001S Fracture of unspecified part of neck of right femur, sequela: Secondary | ICD-10-CM

## 2018-03-29 NOTE — Progress Notes (Signed)
Location:   [place] Room Number: 125/P Place of Service:  SNF [phone])  Provider: Granville Lewis  PCP: Celene Squibb, MD Patient Care Team: Celene Squibb, MD as PCP - General (Internal Medicine)  Extended Emergency Contact Information Primary Emergency Contact: Fudala,John Address: [address]          [place], [place] [address] [place] Phone: [phone] Relation: Spouse  Code Status: Full Code Goals of care:  Advanced Directive information Advanced Directives 03/29/2018  Does Patient Have a Medical Advance Directive? Yes  Type of Advance Directive (No Data)  Does patient want to make changes to medical advance directive? No - Patient declined  Copy of [place] in Chart? No - copy requested  Would patient like information on creating a medical advance directive? No - Patient declined     No Known Allergies  Chief complaint-acute visit secondary to predischarge planning  HPI:  84 y.o. female   Seen today for discharge from facility on Monday-- She has been here for rehab after a right hip repair.  She underwent the right hip repair which was a bipolar replacement secondary to fracture sustained after a fall at home.  She  was readmitted for possible fever thought to be secondary to cellulitis.  In the hospital her urine and blood cultures were negative I was thought fever was most likely caused by right arm cellulitis and she did respond to IV antibiotics and was transitioned to oral antibiotics which she has completed.  Her stay here is been relatively unremarkable- she will need continued PT and OT.  She is on aspirin for DVT prophylaxis and has Norco as needed for pain but does not take this much at all- she also has a PRN muscle relaxer- she is not really complaining of pain today.  She has been seen by orthopedics and thought to be doing well  Recommendation is to continue PT and OT she will be going to an  assisted living facility.  She walks about 125 feet with therapy walker.  In regards to other issues these appear to be stable she did have postop anemia but this appears to be improving with a hemoglobin of 10.1 on lab done on July 15.  She also had mild hypokalemia which was supplemented and has normalized at 4.5 on July 15 lab.  Regards to hypertension this is been stable there is an order for Cozaar if systolics greater than 798 blood pressure today is 128/65.  She also has a history of some cognitive deficits with a previous history of brain hemorrhage- this apparently has been fairly baseline during her stay here she is oriented to self can carry on a short conversation.  Currently she has no acute complaints nursing does not report any acute concerns       Past Medical History:  Diagnosis Date  . HTN (hypertension)   . Stroke [facility])     Past Surgical History:  Procedure Laterality Date  . BRAIN SURGERY    . HIP ARTHROPLASTY Right 03/05/2018   Procedure: ARTHROPLASTY BIPOLAR HIP (HEMIARTHROPLASTY);  Surgeon: Carole Civil, MD;  Location: [facility];  Service: Orthopedics;  Laterality: Right;      reports that she has never smoked. She has never used smokeless tobacco. She reports that she does not drink alcohol or use drugs. Social History   Socioeconomic History  . Marital status: Married    Spouse name: Jenny Reichmann   .  Number of children: 0  . Years of education: 62  . Highest education level: Not on file  Occupational History  . Occupation: Medical sales representative  . Occupation: Retired   Scientific laboratory technician  . Financial resource strain: Not on file  . Food insecurity:    Worry: Not on file    Inability: Not on file  . Transportation needs:    Medical: Not on file    Non-medical: Not on file  Tobacco Use  . Smoking status: Never Smoker  . Smokeless tobacco: Never Used  Substance and Sexual Activity  . Alcohol use: No  . Drug use: No  . Sexual activity: Not on file  Lifestyle  .  Physical activity:    Days per week: Not on file    Minutes per session: Not on file  . Stress: Not on file  Relationships  . Social connections:    Talks on phone: Not on file    Gets together: Not on file    Attends religious service: Not on file    Active member of club or organization: Not on file    Attends meetings of clubs or organizations: Not on file    Relationship status: Not on file  . Intimate partner violence:    Fear of current or ex partner: Not on file    Emotionally abused: Not on file    Physically abused: Not on file    Forced sexual activity: Not on file  Other Topics Concern  . Not on file  Social History Narrative   Patient lives at home with husband Jenny Reichmann.    Patient has no children.    Patient is retired.    Patient has a high school education.    Patient is right handed.    Functional Status Survey:    No Known Allergies  Pertinent  Health Maintenance Due  Topic Date Due  . DEXA SCAN  04/11/2018 (Originally 03/23/1997)  . PNA vac Low Risk Adult (1 of 2 - PCV13) 04/11/2018 (Originally 03/23/1997)  . INFLUENZA VACCINE  04/04/2018    Medications: Outpatient Encounter Medications as of 03/29/2018  Medication Sig  . acetaminophen (TYLENOL) 500 MG tablet Take 500 mg by mouth every 6 (six) hours as needed.  Marland Kitchen aspirin EC 325 MG EC tablet Take 1 tablet (325 mg total) by mouth daily with breakfast.  . aspirin EC 81 MG tablet Take 81 mg by mouth daily. Starting 04/05/2018  . cyclobenzaprine (FLEXERIL) 5 MG tablet Take 1 tablet (5 mg total) by mouth 3 (three) times daily as needed for muscle spasms.  . diclofenac sodium (VOLTAREN) 1 % GEL Apply 4 g topically every 6 (six) hours. Apply to right hip for pain not to exceed 32 grams daily to all joints  . docusate sodium (COLACE) 100 MG capsule Take 1 capsule (100 mg total) by mouth 2 (two) times daily.  Marland Kitchen HYDROcodone-acetaminophen (NORCO/VICODIN) 5-325 MG tablet Take 1 tablet by mouth every 6 (six) hours as needed  for moderate pain.  Marland Kitchen losartan (COZAAR) 25 MG tablet Take 25 mg by mouth daily as needed (when blood pressure is over 140).   Marland Kitchen senna-docusate (SENOKOT-S) 8.6-50 MG tablet Take 1 tablet by mouth at bedtime as needed for mild constipation.  . [DISCONTINUED] ALPRAZolam (XANAX) 0.25 MG tablet Take 0.5 tablets (0.125 mg total) by mouth at bedtime as needed for anxiety or sleep.   No facility-administered encounter medications on file as of 03/29/2018.      Review of Systems  Review of systems is somewhat limited secondary to patient being a poor historian- provided by nursing as well.  In general no complaints of fever chills.  Skin is not complain of rashes or itching per nursing surgical site right hip appears benign.  Head ears eyes nose mouth and throat is not complaining of any sore throat or visual changes.  Respiratory denies any shortness of breath or cough.  Cardiac does not complain of chest pain has minimal lower extremity edema.  GI is not complaining of abdominal pain nausea vomiting diarrhea constipation-says she does not have a very good appetite looks like she is lost about 10 pounds since admission but says she has no trouble swallowing or abdominal discomfort.  GU is not complaining of dysuria.  Musculoskeletal says her pain is controlled is not really complaining of pain today.  Neurologic does not complain of dizziness headache or syncope.  Psych does have cognitive deficits as noted above nursing is not reporting any overt signs of depression or anxiety  Vitals:   03/29/18 1024  BP: 128/65  Pulse: 69  Resp: (!) 22  Temp: 97.9 F (36.6 C)  TempSrc: Oral   Weight is 151.8 pounds Physical Exam   In general this is a pleasant elderly female who appears well-nourished in no distress sitting comfortably in her wheelchair.  Her skin is warm and dry surgical site could not be assessed secondary to patient positioning per nursing this is stable without sign of  infection.  I do not see any concerning erythema of her arms bilaterally  Eyes she has prescription lenses sclera and conjunctive are clear visual acuity appears grossly intact.  Oropharynx is clear mucous membranes moist.  Chest is clear to auscultation there is no labored breathing.  Heart is regular rate and rhythm without murmur gallop or rub she has minimal lower extremity edema pedal pulse is intact.  Abdomen is soft nontender with positive bowel sounds.  Musculoskeletal moves all extremities x4 again she is using a rolling walker with therapy- strength appears to be intact all 4 extremities.  Neurologic is grossly intact her speech is clear no lateralizing findings.  Psych she is oriented to self can carry on a short conversation but does get confused easily-this appears to be her baseline-she continues to be pleasant and cooperative.    Labs reviewed: Basic Metabolic Panel: Recent Labs    03/09/18 0657 03/11/18 0810 03/18/18 0115  NA 137 137 140  K 4.0 3.3* 4.5  CL 105 101 104  CO2 25 28 29   GLUCOSE 107* 107* 100*  BUN 10 10 11   CREATININE 0.58 0.61 0.75  CALCIUM 7.8* 8.2* 8.8*   Liver Function Tests: Recent Labs    03/07/18 2047 03/08/18 0609  AST 41 42*  ALT 16 16  ALKPHOS 54 54  BILITOT 0.7 0.7  PROT 5.4* 5.4*  ALBUMIN 2.6* 2.4*   No results for input(s): LIPASE, AMYLASE in the last 8760 hours. No results for input(s): AMMONIA in the last 8760 hours. CBC: Recent Labs    03/07/18 2047 03/08/18 0609 03/09/18 0857 03/11/18 0810 03/18/18 0115  WBC 10.4 9.1 6.4 5.6 7.3  NEUTROABS 7.2 6.8 4.1  --   --   HGB 9.2* 9.1* 9.0* 9.1* 10.1*  HCT 27.9* 28.3* 27.5* 28.3* 32.0*  MCV 91.2 92.2 91.7 91.3 93.6  PLT 169 187 198 271 450*   Cardiac Enzymes: No results for input(s): CKTOTAL, CKMB, CKMBINDEX, TROPONINI in the last 8760 hours. BNP: Invalid input(s): POCBNP CBG:  Recent Labs    03/05/18 0514 03/05/18 0742 03/05/18 1631  GLUCAP 136* 113*  114*    Procedures and Imaging Studies During Stay: Dg Chest 1 View  Result Date: 03/04/2018 CLINICAL DATA:  Trip and fall with known right hip fracture EXAM: CHEST  1 VIEW COMPARISON:  04/09/2010 FINDINGS: Cardiac shadow is within normal limits. Lungs are well aerated bilaterally. No focal infiltrate or effusion is seen. No bony abnormality is noted. IMPRESSION: No acute abnormality noted. Electronically Signed   By: Inez Catalina M.D.   On: 03/04/2018 12:53   Dg Pelvis Portable  Result Date: 03/05/2018 CLINICAL DATA:  Post right hip replacement EXAM: PORTABLE PELVIS 1-2 VIEWS COMPARISON:  None. FINDINGS: A portable view of the pelvis shows right total hip replacement components in good position. No complicating features are seen. The pelvic rami are intact. The left hip joint space is within normal limits for age. The SI joints appear corticated. IMPRESSION: Components of right total hip replacement are in good position with no complicating features. Electronically Signed   By: Ivar Drape M.D.   On: 03/05/2018 12:27   Dg Chest Portable 1 View  Result Date: 03/07/2018 CLINICAL DATA:  Code sepsis.  Fever. EXAM: PORTABLE CHEST 1 VIEW COMPARISON:  03/04/2018 and prior radiographs FINDINGS: The cardiomediastinal silhouette is unremarkable. Mild LEFT basilar opacity noted and may represent atelectasis or airspace disease. There is no evidence of pulmonary edema, suspicious pulmonary nodule/mass, pleural effusion, or pneumothorax. No acute bony abnormalities are identified. IMPRESSION: Mild LEFT basilar opacity - slightly favor atelectasis over airspace disease. Electronically Signed   By: Margarette Canada M.D.   On: 03/07/2018 20:54   Dg Hip Unilat W Or Wo Pelvis 2-3 Views Right  Result Date: 03/04/2018 CLINICAL DATA:  Recent trip and fall with right hip pain, initial encounter EXAM: DG HIP (WITH OR WITHOUT PELVIS) 3V RIGHT COMPARISON:  None. FINDINGS: Pelvic ring is intact. Subcapital femoral neck fracture is  noted with impaction and angulation at the fracture site. No other fractures are seen. No soft tissue changes are noted. IMPRESSION: Subcapital right femoral neck fracture. Electronically Signed   By: Inez Catalina M.D.   On: 03/04/2018 12:48    Assessment/Plan:    #1 history of right hip fracture with repair-she appears to be doing well she has seen orthopedics with follow-up in approximately 6 weeks- she is on aspirin for DVT prophylaxis until 1 August- continues on muscle relaxer as needed as well as Norco- will discontinue the Norco since she does not really use this much at all-she is denying pain today.  2.  History of possible right arm cellulitis-this appears to be resolved status post antibiotics- is not running a fever-.  3.  Hypertension this appears stable does not appear she needs  Cozaar   much at all--128/65- 118/64-109/56-120/61 recent blood pressure  #4- history of postop anemia this appears improving with a hemoglobin of 10.1 on most recent lab will update before discharge.  5.  History of hypokalemia this was supplemented most recent potassium 4.6 on July 15 will update this before discharge.  6.  History of dementia-with history of brain hemorrhage- appears relatively baseline from what I see today-she will be going to an assisted living facility- continue supportive care.  Nursing has not reported any behavioral issues.  7 weight loss- this will warrant follow-up by primary care provider- does not complain of any difficulty swallowing or abdominal discomfort--suspect there could be a postop recovery aspect to this- she  has been started on supplements   Again will update a CBC and BMP before discharge.f She will be going to an assisted living facility will need PT and OT continued.  We will discontinue the Norco.    ADDENDUM______  Later this afternoon it was decided by family that patient will be staying at facility for the foreseeable future.  Subsequently she  will not be discharged from facility on Monday as originally thought- we will do the updated labs however and monitor-I suspect therapy will be reinitiated as well.  -CPT [id number]-of note greater than 35 minutes spent assessing patient reviewing her chart and labs discussing her status with nursing staff for possible discharge -and coordinating and formulating a plan of care

## 2018-03-30 ENCOUNTER — Encounter (HOSPITAL_COMMUNITY)
Admission: RE | Admit: 2018-03-30 | Discharge: 2018-03-30 | Disposition: A | Discharge status: Home / Self Care | Payer: Medicare Other | Source: Skilled Nursing Facility | Attending: *Deleted | Admitting: *Deleted

## 2018-03-30 DIAGNOSIS — I10 Essential (primary) hypertension: Secondary | ICD-10-CM | POA: Diagnosis not present

## 2018-03-30 LAB — BASIC METABOLIC PANEL
Anion gap: 6 (ref 5–15)
BUN: 13 mg/dL (ref 8–23)
CALCIUM: 8.9 mg/dL (ref 8.9–10.3)
CO2: 29 mmol/L (ref 22–32)
Chloride: 106 mmol/L (ref 98–111)
Creatinine, Ser: 0.8 mg/dL (ref 0.44–1.00)
GFR calc Af Amer: >60 mL/min (ref >60)
Glucose, Bld: 107 mg/dL — ABNORMAL HIGH (ref 70–99)
Potassium: 3.8 mmol/L (ref 3.5–5.1)
Sodium: 141 mmol/L (ref 135–145)

## 2018-03-30 LAB — CBC WITH DIFFERENTIAL/PLATELET
BASOS ABS: 0.1 10*3/uL (ref 0.0–0.1)
Basophils Relative: 1 %
Eosinophils Absolute: 0.3 10*3/uL (ref 0.0–0.7)
Eosinophils Relative: 6 %
HEMATOCRIT: 33.2 % — AB (ref 36.0–46.0)
Hemoglobin: 10.4 g/dL — ABNORMAL LOW (ref 12.0–15.0)
Lymphocytes Relative: 27 %
Lymphs Abs: 1.3 10*3/uL (ref 0.7–4.0)
MCH: 28.6 pg (ref 26.0–34.0)
MCHC: 31.3 g/dL (ref 30.0–36.0)
MCV: 91.2 fL (ref 78.0–100.0)
Monocytes Absolute: 0.7 10*3/uL (ref 0.1–1.0)
Monocytes Relative: 14 %
Neutro Abs: 2.6 10*3/uL (ref 1.7–7.7)
Neutrophils Relative %: 52 %
PLATELETS: 283 10*3/uL (ref 150–400)
RBC: 3.64 MIL/uL — ABNORMAL LOW (ref 3.87–5.11)
RDW: 13.7 % (ref 11.5–15.5)
WBC: 5 10*3/uL (ref 4.0–10.5)

## 2018-04-09 ENCOUNTER — Encounter: Payer: Self-pay | Admitting: Internal Medicine

## 2018-04-09 ENCOUNTER — Non-Acute Institutional Stay (SKILLED_NURSING_FACILITY): Payer: Medicare Other | Admitting: Internal Medicine

## 2018-04-09 DIAGNOSIS — M79645 Pain in left finger(s): Secondary | ICD-10-CM | POA: Diagnosis not present

## 2018-04-09 DIAGNOSIS — I10 Essential (primary) hypertension: Secondary | ICD-10-CM | POA: Diagnosis not present

## 2018-04-09 DIAGNOSIS — D649 Anemia, unspecified: Secondary | ICD-10-CM | POA: Diagnosis not present

## 2018-04-09 NOTE — Progress Notes (Signed)
Location:   [place] Room Number: 125/P Place of Service:  SNF [phone]) Provider:  Cory Roughen, MD  Patient Care Team: Celene Squibb, MD as PCP - General (Internal Medicine)  Extended Emergency Contact Information Primary Emergency Contact: Decarlo,John Address: [address]          [place], [place] [address] [place] Phone: [phone] Relation: Spouse  Code Status:  Full Code Goals of care: Advanced Directive information Advanced Directives 04/09/2018  Does Patient Have a Medical Advance Directive? Yes  Type of Advance Directive (No Data)  Does patient want to make changes to medical advance directive? No - Patient declined  Copy of [place] in Chart? No - copy requested  Would patient like information on creating a medical advance directive? No - Patient declined     Chief complaint-acute visit secondary to soreness left fifth finger  HPI:  Pt is a 84 y.o. female seen today for an acute visit for apparent complaints of soreness of her left fifth finger.  She is here for rehab status post right hip fracture with repair and has done well.  She was planning on discharge but family decided to keep her in the facility for now--  She appears to be doing well-  does also have a history of hypertension postop anemia which appears to be improving--and hypokalemia which appears to have normalized  Apparently she has complained of some soreness to her left fifth finger and I am following up on this-actually when I saw her today she was not really complaining of much soreness  See nursing staff has applied a topical antibiotic  Vital signs appear to be stable does not really have any  other complaints today--     Past Medical History:  Diagnosis Date  . HTN (hypertension)   . Stroke [facility])    Past Surgical History:  Procedure Laterality Date  . BRAIN SURGERY    . HIP ARTHROPLASTY Right 03/05/2018   Procedure: ARTHROPLASTY BIPOLAR HIP (HEMIARTHROPLASTY);  Surgeon: Carole Civil, MD;  Location: [facility];  Service: Orthopedics;  Laterality: Right;    No Known Allergies  Outpatient Encounter Medications as of 04/09/2018  Medication Sig  . acetaminophen (TYLENOL) 500 MG tablet Take 500 mg by mouth every 6 (six) hours as needed.  Marland Kitchen aspirin EC 81 MG tablet Take 81 mg by mouth daily. Starting 04/05/2018  . cyclobenzaprine (FLEXERIL) 5 MG tablet Take 1 tablet (5 mg total) by mouth 3 (three) times daily as needed for muscle spasms.  . diclofenac sodium (VOLTAREN) 1 % GEL Apply 4 g topically every 6 (six) hours. Apply to right hip for pain not to exceed 32 grams daily to all joints  . docusate sodium (COLACE) 100 MG capsule Take 1 capsule (100 mg total) by mouth 2 (two) times daily.  Marland Kitchen losartan (COZAAR) 25 MG tablet Take 25 mg by mouth daily as needed (when blood pressure is over 140).   Marland Kitchen senna-docusate (SENOKOT-S) 8.6-50 MG tablet Take 1 tablet by mouth at bedtime as needed for mild constipation.   No facility-administered encounter medications on file as of 04/09/2018.     Review of Systems   This is somewhat limited since patient appears to be a poor historian but she is more talkative and interactive than I have seen her previously   In general she is not complaining of any fever chills  Skin does not complain of significant soreness  at this time of the left fifth finger but apparently has complained of this at times   Head ears eyes nose mouth and throat is not complaining of sore throat or visual changes   Respiratory does not complain of shortness of breath or cough   Cardiac does not complain of chest pain has mild lower extremity edema   GI is not complaining of abdominal pain nausea vomiting diarrhea or constipation    GU is not complaining of dysuria    Musculoskeletal -is not really complaining of joint pain at this time in fact her Norco has been  discontinued  Neurologic is not complaining of dizziness headache or numbness   Psych does have some cognitive deficits but continues to be pleasant--does not complain of being anxious or depressed   There is no immunization history on file for this patient. Pertinent  Health Maintenance Due  Topic Date Due  . DEXA SCAN  04/11/2018 (Originally 03/23/1997)  . PNA vac Low Risk Adult (1 of 2 - PCV13) 04/11/2018 (Originally 03/23/1997)  . INFLUENZA VACCINE  05/10/2018 (Originally 04/04/2018)   Fall Risk  05/01/2016  Falls in the past year? No  Comment Emmi Telephone Survey: data to providers prior to load   Functional Status Survey:    She is afebrile pulse of 80 respirations of 18 blood pressure manually 132/80  Physical Exam   In general this is a pleasant elderly female who appears well-nourished and in no distress sitting comfortably in her wheelchai    Her skin is warm and dry- left fifth finger under the nailbed there is a very minimal amount of erythema-I do not really see any drainage edema or significant tenderness with palpation---  Eyes she has prescription lenses visual acuity appears to be intact.  Oropharynx clear mucous membranes moist.  Chest is clear to auscultation there is no labored breathing.  Heart is regular rate and rhythm without murmur gallop or rub she has mild lower extremity edema bilaterally.  Abdomen is soft nontender with positive bowel sounds.  Musculoskeletal she is ambulating in a wheelchair appears able to move her extremities at baseline limited exam since she is in a wheelchair  Neurologic appears grossly intact her speech is clear she is alert   Psych she is oriented to self is more talkative than I have seen her previously and interactive carry on a short conversation- she says she believes her husband is in the hospital   Labs reviewed: Recent Labs    03/11/18 0810 03/18/18 0115 03/30/18 0400  NA 137 140 141  K 3.3* 4.5 3.8  CL  101 104 106  CO2 28 29 29   GLUCOSE 107* 100* 107*  BUN 10 11 13   CREATININE 0.61 0.75 0.80  CALCIUM 8.2* 8.8* 8.9   Recent Labs    03/07/18 2047 03/08/18 0609  AST 41 42*  ALT 16 16  ALKPHOS 54 54  BILITOT 0.7 0.7  PROT 5.4* 5.4*  ALBUMIN 2.6* 2.4*   Recent Labs    03/08/18 0609 03/09/18 0857 03/11/18 0810 03/18/18 0115 03/30/18 0400  WBC 9.1 6.4 5.6 7.3 5.0  NEUTROABS 6.8 4.1  --   --  2.6  HGB 9.1* 9.0* 9.1* 10.1* 10.4*  HCT 28.3* 27.5* 28.3* 32.0* 33.2*  MCV 92.2 91.7 91.3 93.6 91.2  PLT 187 198 271 450* 283   No results found for: TSH Lab Results  Component Value Date   HGBA1C (H) 04/12/2010    5.9 (NOTE)  According to the ADA Clinical Practice Recommendations for 2011, when HbA1c is used as a screening test:   >=6.5%   Diagnostic of Diabetes Mellitus           (if abnormal result  is confirmed)  5.7-6.4%   Increased risk of developing Diabetes Mellitus  References:Diagnosis and Classification of Diabetes Mellitus,Diabetes XBJY,7829,56(OZHYQ 1):S62-S69 and Standards of Medical Care in         Diabetes - 2011,Diabetes MVHQ,4696,29  (Suppl 1):S11-S61.   Lab Results  Component Value Date   CHOL  04/14/2010    158        ATP III CLASSIFICATION:  <200     mg/dL   Desirable  200-239  mg/dL   Borderline High  >=240    mg/dL   High          HDL 38 (L) 04/14/2010   LDLCALC (H) 04/14/2010    102        Total Cholesterol/HDL:CHD Risk Coronary Heart Disease Risk Table                     Men   Women  1/2 Average Risk   3.4   3.3  Average Risk       5.0   4.4  2 X Average Risk   9.6   7.1  3 X Average Risk  23.4   11.0        Use the calculated Patient Ratio above and the CHD Risk Table to determine the patient's CHD Risk.        ATP III CLASSIFICATION (LDL):  <100     mg/dL   Optimal  100-129  mg/dL   Near or Above                    Optimal  130-159  mg/dL   Borderline  160-189  mg/dL    High  >190     mg/dL   Very High   TRIG 90 04/14/2010   CHOLHDL 4.2 04/14/2010    Significant Diagnostic Results in last 30 days:  No results found.  Assessment/Plan  #1- left fifth finger discomfort-this appears fairly minimal- at this point continue antibiotic ointment every shift she appears to be getting some relief with this- monitor for any changes including increased edema erythema drainage or pain- at this point I do not feel an antibiotic oral would be warranted but this will have to be watched.  2 hypertension has an order for Cozaar if systolic is greater than 528- does not appear to be the case I got a manual reading of 132/80 previous readings 128/70-111/66- 102/56- at this point will monitor   #3 history of postop anemia this continues to improve with a hemoglobin of 10.7 on lab July 27  [id number]

## 2018-04-09 NOTE — Progress Notes (Signed)
Location:   [place] Room Number: 125/P Place of Service:  SNF (31) Provider:Anjali Lyndel Safe MD  Celene Squibb, MD  Patient Care Team: Celene Squibb, MD as PCP - General (Internal Medicine)  Extended Emergency Contact Information Primary Emergency Contact: Vest,John Address: [address]          [place], [place] [address] [place] Phone: [phone] Relation: Spouse  Code Status:  Full Code Goals of care: Advanced Directive information Advanced Directives 04/09/2018  Does Patient Have a Medical Advance Directive? Yes  Type of Advance Directive (No Data)  Does patient want to make changes to medical advance directive? No - Patient declined  Copy of [place] in Chart? No - copy requested  Would patient like information on creating a medical advance directive? No - Patient declined     Chief Complaint  Patient presents with  . Acute Visit    Patient is being seen for F/U visit    HPI:  Pt is a 84 y.o. female seen today for an acute visit for    Past Medical History:  Diagnosis Date  . HTN (hypertension)   . Stroke [facility])    Past Surgical History:  Procedure Laterality Date  . BRAIN SURGERY    . HIP ARTHROPLASTY Right 03/05/2018   Procedure: ARTHROPLASTY BIPOLAR HIP (HEMIARTHROPLASTY);  Surgeon: Carole Civil, MD;  Location: [facility]S;  Service: Orthopedics;  Laterality: Right;    No Known Allergies  Outpatient Encounter Medications as of 04/09/2018  Medication Sig  . acetaminophen (TYLENOL) 500 MG tablet Take 500 mg by mouth every 6 (six) hours as needed.  Marland Kitchen aspirin EC 81 MG tablet Take 81 mg by mouth daily. Starting 04/05/2018  . cyclobenzaprine (FLEXERIL) 5 MG tablet Take 1 tablet (5 mg total) by mouth 3 (three) times daily as needed for muscle spasms.  . diclofenac sodium (VOLTAREN) 1 % GEL Apply 4 g topically every 6 (six) hours. Apply to right hip for pain not to exceed 32 grams daily to all joints  .  docusate sodium (COLACE) 100 MG capsule Take 1 capsule (100 mg total) by mouth 2 (two) times daily.  Marland Kitchen losartan (COZAAR) 25 MG tablet Take 25 mg by mouth daily as needed (when blood pressure is over 140).   Marland Kitchen senna-docusate (SENOKOT-S) 8.6-50 MG tablet Take 1 tablet by mouth at bedtime as needed for mild constipation.  . [DISCONTINUED] aspirin EC 325 MG EC tablet Take 1 tablet (325 mg total) by mouth daily with breakfast.  . [DISCONTINUED] HYDROcodone-acetaminophen (NORCO/VICODIN) 5-325 MG tablet Take 1 tablet by mouth every 6 (six) hours as needed for moderate pain.   No facility-administered encounter medications on file as of 04/09/2018.      Review of Systems   There is no immunization history on file for this patient. Pertinent  Health Maintenance Due  Topic Date Due  . DEXA SCAN  04/11/2018 (Originally 03/23/1997)  . PNA vac Low Risk Adult (1 of 2 - PCV13) 04/11/2018 (Originally 03/23/1997)  . INFLUENZA VACCINE  05/10/2018 (Originally 04/04/2018)   Fall Risk  05/01/2016  Falls in the past year? No  Comment Emmi Telephone Survey: data to providers prior to load   Functional Status Survey:    Vitals:   04/09/18 0944  BP: 102/62  Pulse: 82  Resp: 20  Temp: (!) 97.1 F (36.2 C)  TempSrc: Oral   There is no height or weight on file to calculate BMI.  Physical Exam  Labs reviewed: Recent Labs    03/11/18 0810 03/18/18 0115 03/30/18 0400  NA 137 140 141  K 3.3* 4.5 3.8  CL 101 104 106  CO2 28 29 29   GLUCOSE 107* 100* 107*  BUN 10 11 13   CREATININE 0.61 0.75 0.80  CALCIUM 8.2* 8.8* 8.9   Recent Labs    03/07/18 2047 03/08/18 0609  AST 41 42*  ALT 16 16  ALKPHOS 54 54  BILITOT 0.7 0.7  PROT 5.4* 5.4*  ALBUMIN 2.6* 2.4*   Recent Labs    03/08/18 0609 03/09/18 0857 03/11/18 0810 03/18/18 0115 03/30/18 0400  WBC 9.1 6.4 5.6 7.3 5.0  NEUTROABS 6.8 4.1  --   --  2.6  HGB 9.1* 9.0* 9.1* 10.1* 10.4*  HCT 28.3* 27.5* 28.3* 32.0* 33.2*  MCV 92.2 91.7 91.3 93.6  91.2  PLT 187 198 271 450* 283   No results found for: TSH Lab Results  Component Value Date   HGBA1C (H) 04/12/2010    5.9 (NOTE)                                                                       According to the ADA Clinical Practice Recommendations for 2011, when HbA1c is used as a screening test:   >=6.5%   Diagnostic of Diabetes Mellitus           (if abnormal result  is confirmed)  5.7-6.4%   Increased risk of developing Diabetes Mellitus  References:Diagnosis and Classification of Diabetes Mellitus,Diabetes JOIN,8676,72(CNOBS 1):S62-S69 and Standards of Medical Care in         Diabetes - 2011,Diabetes Care,2011,34  (Suppl 1):S11-S61.   Lab Results  Component Value Date   CHOL  04/14/2010    158        ATP III CLASSIFICATION:  <200     mg/dL   Desirable  200-239  mg/dL   Borderline High  >=240    mg/dL   High          HDL 38 (L) 04/14/2010   LDLCALC (H) 04/14/2010    102        Total Cholesterol/HDL:CHD Risk Coronary Heart Disease Risk Table                     Men   Women  1/2 Average Risk   3.4   3.3  Average Risk       5.0   4.4  2 X Average Risk   9.6   7.1  3 X Average Risk  23.4   11.0        Use the calculated Patient Ratio above and the CHD Risk Table to determine the patient's CHD Risk.        ATP III CLASSIFICATION (LDL):  <100     mg/dL   Optimal  100-129  mg/dL   Near or Above                    Optimal  130-159  mg/dL   Borderline  160-189  mg/dL   High  >190     mg/dL   Very High   TRIG 90 04/14/2010   CHOLHDL 4.2 04/14/2010  Significant Diagnostic Results in last 30 days:  No results found.  Assessment/Plan There are no diagnoses linked to this encounter.   Family/ staff Communication:   Labs/tests ordered:    This encounter was created in error - please disregard.

## 2018-04-23 ENCOUNTER — Encounter: Payer: Self-pay | Admitting: *Deleted

## 2018-04-23 ENCOUNTER — Other Ambulatory Visit: Payer: Self-pay | Admitting: *Deleted

## 2018-04-23 NOTE — Patient Outreach (Signed)
[name]) Care Management  04/23/2018  Chelsea Pratt 1929/10/31 [id number]   Onsite IDT meeting.  Team states that patient spouse plans to take patient home soon. He has been at facility under Skilled care and will be discharging home soon. Patient spouse has decided not to take patient to ALF and will take patient home with increased private pay care.   Met with patient in spouse's room. Patient has "memory issues" per spouse from a stroke she had a few years ago. He reports that patient sister [name] is POA and assists them with financial matters and helps with bills. [name] is looking to get extra help in the home at least 8 hours a day for 5 days a week.   RNCM discussed [facility] care management program with spouse and having a [facility] LCSW follow patient while in SNF to help with transition home. Spouse gives verbal consent for [facility] care management services. He will have their POA, [name] call RNCM to further discuss.   Plan to make [facility] referral For LCSW to follow patient while at [facility].   [name]. [name], RN, BSN, [place] [phone]) Business Cell  [phone]) Toll Free Office

## 2018-04-25 ENCOUNTER — Other Ambulatory Visit: Payer: Self-pay | Admitting: *Deleted

## 2018-04-25 NOTE — Patient Outreach (Signed)
[facility]) Care Management  04/25/2018  Chelsea Pratt 08-16-1930 [id number]   CSW received referral from Cassandra that patient was at [facility] SNF & to help spouse and patient transition back to home. Patient is at [facility] under private pay. Verbal consent from spouse. Chelsea Pratt (patient's sister) is POA. CSW met with patient & her spouse, Chelsea Pratt at [facility] to discuss discharge plans. [name] husband informed CSW that patient will be going to [place] [facility] "indefinitely" but is unsure of her actual discharge date. Patient is content with plan to go to ALF rather than home and has support from her brother & sister. CSW will check back with patient in 2 weeks to ensure discharge to ALF has occurred and assess for further [facility] needs.    Raynaldo Opitz, LCSW [facility]  Clinical Social Worker cell #: [phone]

## 2018-04-29 ENCOUNTER — Encounter: Payer: Self-pay | Admitting: Internal Medicine

## 2018-04-29 ENCOUNTER — Non-Acute Institutional Stay (SKILLED_NURSING_FACILITY): Payer: Medicare Other | Admitting: Internal Medicine

## 2018-04-29 DIAGNOSIS — D649 Anemia, unspecified: Secondary | ICD-10-CM

## 2018-04-29 DIAGNOSIS — E876 Hypokalemia: Secondary | ICD-10-CM

## 2018-04-29 DIAGNOSIS — Z96641 Presence of right artificial hip joint: Secondary | ICD-10-CM | POA: Diagnosis not present

## 2018-04-29 DIAGNOSIS — I10 Essential (primary) hypertension: Secondary | ICD-10-CM

## 2018-04-29 NOTE — Progress Notes (Signed)
Location:    [place] Room Number: 125/P Place of Service:  SNF [phone])  Provider: Granville Lewis PA-C  PCP: Celene Squibb, MD Patient Care Team: Celene Squibb, MD as PCP - General (Internal Medicine) Standley Brooking, LCSW as [place] [facility] (Licensed Clinical Social Worker)  Extended Emergency Contact Information Primary Emergency Contact: [place] Address: [address]          [place], [place] [address] [place] Phone: [phone] Relation: Spouse  Code Status: Full Code Goals of care:  Advanced Directive information Advanced Directives 04/29/2018  Does Patient Have a Medical Advance Directive? Yes  Type of Advance Directive (No Data)  Does patient want to make changes to medical advance directive? No - Patient declined  Copy of [place] in Chart? No - copy requested  Would patient like information on creating a medical advance directive? No - Patient declined     No Known Allergies  Chief Complaint  Patient presents with  . Discharge Note    Discharge Visit    HPI:  84 y.o. female  Seen today for discharge from facility.  Patient has been here for rehabilitation after sustaining a right hip fracture that was surgically repaired. She was readmitted to the hospital for possible fever thought to be secondary to cellulitis in her left arm this did resolve with antibiotics.  Since then her stay has been quite unremarkable she has gained strength- has had some cognitive deficiencies but has done well with supportive care and has been more alert and more energetic lately.  She will be going to an assisted living facility.  She does have a history of hypertension but this is been stable with systolics largely in the 6/59/9357 range- she is on losartan as needed for systolic over 017 but this rarely happens.  She also has postop anemia which appears to be improving with a hemoglobin of 10.4  on most recent lab will recheck this prior to discharge.  She also had hypokalemia but this resolved-will recheck this as well potassium was 3.8 on late July lab.  She is sitting in her wheelchair comfortably is a bit upset because her husband who was recently here i has been discharged home- she will be going to an assisted living facility secondary to her increased care needs and limited ability of her elderly husband to care for her.  She will need continued PT and OT as well as a wheelchair to assist with ambulation      Past Medical History:  Diagnosis Date  . HTN (hypertension)   . Stroke [facility])     Past Surgical History:  Procedure Laterality Date  . BRAIN SURGERY    . HIP ARTHROPLASTY Right 03/05/2018   Procedure: ARTHROPLASTY BIPOLAR HIP (HEMIARTHROPLASTY);  Surgeon: Carole Civil, MD;  Location: [facility];  Service: Orthopedics;  Laterality: Right;      reports that she has never smoked. She has never used smokeless tobacco. She reports that she does not drink alcohol or use drugs. Social History   Socioeconomic History  . Marital status: Married    Spouse name: Jenny Reichmann   . Number of children: 0  . Years of education: 41  . Highest education level: Not on file  Occupational History  . Occupation: Medical sales representative  . Occupation: Retired   Scientific laboratory technician  . Financial resource strain: Not on file  . Food insecurity:    Worry: Not on file  Inability: Not on file  . Transportation needs:    Medical: Not on file    Non-medical: Not on file  Tobacco Use  . Smoking status: Never Smoker  . Smokeless tobacco: Never Used  Substance and Sexual Activity  . Alcohol use: No  . Drug use: No  . Sexual activity: Not on file  Lifestyle  . Physical activity:    Days per week: Not on file    Minutes per session: Not on file  . Stress: Not on file  Relationships  . Social connections:    Talks on phone: Not on file    Gets together: Not on file    Attends religious service: Not  on file    Active member of club or organization: Not on file    Attends meetings of clubs or organizations: Not on file    Relationship status: Not on file  . Intimate partner violence:    Fear of current or ex partner: Not on file    Emotionally abused: Not on file    Physically abused: Not on file    Forced sexual activity: Not on file  Other Topics Concern  . Not on file  Social History Narrative   Patient lives at home with husband Jenny Reichmann.    Patient has no children.    Patient is retired.    Patient has a high school education.    Patient is right handed.    Functional Status Survey:    No Known Allergies  Pertinent  Health Maintenance Due  Topic Date Due  . INFLUENZA VACCINE  05/10/2018 (Originally 04/04/2018)  . DEXA SCAN  05/30/2018 (Originally 03/23/1997)  . PNA vac Low Risk Adult (1 of 2 - PCV13) 05/30/2018 (Originally 03/23/1997)    Medications: Outpatient Encounter Medications as of 04/29/2018  Medication Sig  . acetaminophen (TYLENOL) 500 MG tablet Take 500 mg by mouth every 6 (six) hours as needed.  Marland Kitchen aspirin EC 81 MG tablet Take 81 mg by mouth daily. Starting 04/05/2018  . cyclobenzaprine (FLEXERIL) 5 MG tablet Take 1 tablet (5 mg total) by mouth 3 (three) times daily as needed for muscle spasms.  . diclofenac sodium (VOLTAREN) 1 % GEL Apply 4 g topically every 6 (six) hours. Apply to right hip for pain not to exceed 32 grams daily to all joints  . docusate sodium (COLACE) 100 MG capsule Take 1 capsule (100 mg total) by mouth 2 (two) times daily.  Marland Kitchen losartan (COZAAR) 25 MG tablet Take 25 mg by mouth daily as needed (when blood pressure is over 140).   Marland Kitchen senna-docusate (SENOKOT-S) 8.6-50 MG tablet Take 1 tablet by mouth at bedtime as needed for mild constipation.   No facility-administered encounter medications on file as of 04/29/2018.      Review of Systems  Somewhat limited secondary to patient being a poor historian.  General not complaining of fever chills  she is little upset that she will not be with her husband.  Skin is not complaining of rashes or itching cellulitis appears resolved.  Head ears eyes nose mouth and throat not complain of visual changes sore throat or difficulty swallowing.  Respiratory denies shortness of breath or cough.  Cardiac is not complaining of chest pain has quite mild lower extremity edema.  GI is not complaining of abdominal pain nausea vomiting diarrhea or constipation  GU does not complain of dysuria.   Musculoskeletal he is ambulatory for short distances with a walker but still largely uses wheelchair-  is not complaining of joint pain.  Neurologic is not complaining of dizziness headache or numbness does have a history of previous brain hemorrhage with some cognitive deficits.  Psych as noted above she is a bit anxious   Vitals:   04/29/18 1058  BP: 131/69  Pulse: 78  Resp: 20  Temp: 97.8 F (36.6 C)  TempSrc: Oral  SpO2: 95%  Weight is 150 pounds this appears to have stabilized Physical Exam   In general this is a pleasant elderly female in no distress she is a little bit anxious.  Skin is warm and dry.  Head ears eyes nose mouth and throat she has prescription lenses visual acuity appears to be intact sclera and conjunctive are clear.  Oropharynx is clear mucous membranes moist.  Chest is clear to auscultation there is no labored breathing heart is regular rate and rhythm without murmur gallop or rub she has quite mild lower extremity edema pedal pulses are intact bilaterally.  Abdomen is soft nontender with positive bowel sounds.  Musculoskeletal is able to move all extremities x4 at baseline she is ambulatory for short distances with a walker is in a wheelchair currently.  Neurologic is grossly intact her speech is clear no lateralizing findings  Psych she is oriented to self is pleasant cooperative and gives somewhat of an accurate history but does have some mild cognitive  deficits with history of brain hemorrhage in the past  Labs reviewed: Basic Metabolic Panel: Recent Labs    03/11/18 0810 03/18/18 0115 03/30/18 0400  NA 137 140 141  K 3.3* 4.5 3.8  CL 101 104 106  CO2 28 29 29   GLUCOSE 107* 100* 107*  BUN 10 11 13   CREATININE 0.61 0.75 0.80  CALCIUM 8.2* 8.8* 8.9   Liver Function Tests: Recent Labs    03/07/18 2047 03/08/18 0609  AST 41 42*  ALT 16 16  ALKPHOS 54 54  BILITOT 0.7 0.7  PROT 5.4* 5.4*  ALBUMIN 2.6* 2.4*   No results for input(s): LIPASE, AMYLASE in the last 8760 hours. No results for input(s): AMMONIA in the last 8760 hours. CBC: Recent Labs    03/08/18 0609 03/09/18 0857 03/11/18 0810 03/18/18 0115 03/30/18 0400  WBC 9.1 6.4 5.6 7.3 5.0  NEUTROABS 6.8 4.1  --   --  2.6  HGB 9.1* 9.0* 9.1* 10.1* 10.4*  HCT 28.3* 27.5* 28.3* 32.0* 33.2*  MCV 92.2 91.7 91.3 93.6 91.2  PLT 187 198 271 450* 283   Cardiac Enzymes: No results for input(s): CKTOTAL, CKMB, CKMBINDEX, TROPONINI in the last 8760 hours. BNP: Invalid input(s): POCBNP CBG: Recent Labs    03/05/18 0514 03/05/18 0742 03/05/18 1631  GLUCAP 136* 113* 114*    Procedures and Imaging Studies During Stay: No results found.  Assessment/Plan:   #1 history of right hip fracture with repair she appears to have made a decent recovery from this she will need continued PT and OT as well as a wheelchair for ambulation she does have PRN Voltaren gel as needed for pain as well as Tylenol as needed-.  She also has a muscle relaxer as needed.  2.  Hypertension  Appears stable currently on no medication she does have a losartan as needed order for systolic greater than 469  #3 postop anemia this appears to be rising gradually last hemoglobin was 10.4 will update this before discharge   4 - history of right arm cellulitis this appears resolved status post antibiotics  #5 history of hypokalemia-  recent levels have been within normal range most recently 3.8  will update this before discharge  #6-history of cognitive deficits with previous history of brain hemorrhage--she appears to be doing well with supportive care.  7-history of constipation Colace twice a day this is not really been an issue recently to my knowledge-she also has an order for as needed senna  Addendum we have received the updated labs  Metabolic panel shows stability with potassium 3.9-BUN of 10 creatinine 0.69 sodium 138  Hemoglobin has shown significant improvement at 12.4 white count is 5.0 platelets 271   PT-[id number]-of note greater than 30 minutes spent discharge summary- greater than 50% of time spent coordinating plan of care for numerous diagnoses

## 2018-04-30 ENCOUNTER — Encounter: Payer: Self-pay | Admitting: Internal Medicine

## 2018-04-30 ENCOUNTER — Encounter (HOSPITAL_COMMUNITY)
Admission: RE | Admit: 2018-04-30 | Discharge: 2018-04-30 | Disposition: A | Discharge status: Home / Self Care | Payer: Medicare Other | Source: Skilled Nursing Facility | Attending: Internal Medicine | Admitting: Internal Medicine

## 2018-04-30 DIAGNOSIS — I69398 Other sequelae of cerebral infarction: Secondary | ICD-10-CM | POA: Diagnosis not present

## 2018-04-30 DIAGNOSIS — I10 Essential (primary) hypertension: Secondary | ICD-10-CM | POA: Diagnosis not present

## 2018-04-30 DIAGNOSIS — I639 Cerebral infarction, unspecified: Secondary | ICD-10-CM | POA: Insufficient documentation

## 2018-04-30 DIAGNOSIS — Z471 Aftercare following joint replacement surgery: Secondary | ICD-10-CM | POA: Insufficient documentation

## 2018-04-30 DIAGNOSIS — Z9181 History of falling: Secondary | ICD-10-CM | POA: Diagnosis not present

## 2018-04-30 LAB — CBC WITH DIFFERENTIAL/PLATELET
BASOS PCT: 1 %
Basophils Absolute: 0 10*3/uL (ref 0.0–0.1)
EOS ABS: 0.2 10*3/uL (ref 0.0–0.7)
Eosinophils Relative: 3 %
HCT: 38.3 % (ref 36.0–46.0)
Hemoglobin: 12.4 g/dL (ref 12.0–15.0)
Lymphocytes Relative: 27 %
Lymphs Abs: 1.4 10*3/uL (ref 0.7–4.0)
MCH: 29 pg (ref 26.0–34.0)
MCHC: 32.4 g/dL (ref 30.0–36.0)
MCV: 89.7 fL (ref 78.0–100.0)
Monocytes Absolute: 0.6 10*3/uL (ref 0.1–1.0)
Monocytes Relative: 11 %
Neutro Abs: 2.9 10*3/uL (ref 1.7–7.7)
Neutrophils Relative %: 58 %
PLATELETS: 271 10*3/uL (ref 150–400)
RBC: 4.27 MIL/uL (ref 3.87–5.11)
RDW: 13.8 % (ref 11.5–15.5)
WBC: 5 10*3/uL (ref 4.0–10.5)

## 2018-04-30 LAB — BASIC METABOLIC PANEL
Anion gap: 7 (ref 5–15)
BUN: 10 mg/dL (ref 8–23)
CO2: 26 mmol/L (ref 22–32)
Calcium: 9.2 mg/dL (ref 8.9–10.3)
Chloride: 105 mmol/L (ref 98–111)
Creatinine, Ser: 0.69 mg/dL (ref 0.44–1.00)
GFR calc Af Amer: >60 mL/min (ref >60)
Glucose, Bld: 108 mg/dL — ABNORMAL HIGH (ref 70–99)
POTASSIUM: 3.9 mmol/L (ref 3.5–5.1)
SODIUM: 138 mmol/L (ref 135–145)

## 2018-05-03 ENCOUNTER — Ambulatory Visit (INDEPENDENT_AMBULATORY_CARE_PROVIDER_SITE_OTHER): Payer: Medicare Other | Admitting: Orthopedic Surgery

## 2018-05-03 VITALS — BP 153/70 | HR 71 | Ht 66.0 in | Wt 151.0 lb

## 2018-05-03 DIAGNOSIS — I10 Essential (primary) hypertension: Secondary | ICD-10-CM | POA: Diagnosis not present

## 2018-05-03 DIAGNOSIS — S72001D Fracture of unspecified part of neck of right femur, subsequent encounter for closed fracture with routine healing: Secondary | ICD-10-CM | POA: Diagnosis not present

## 2018-05-03 DIAGNOSIS — Z7982 Long term (current) use of aspirin: Secondary | ICD-10-CM | POA: Diagnosis not present

## 2018-05-03 DIAGNOSIS — Z87440 Personal history of urinary (tract) infections: Secondary | ICD-10-CM | POA: Diagnosis not present

## 2018-05-03 DIAGNOSIS — D649 Anemia, unspecified: Secondary | ICD-10-CM | POA: Diagnosis not present

## 2018-05-03 DIAGNOSIS — Z96641 Presence of right artificial hip joint: Secondary | ICD-10-CM

## 2018-05-03 DIAGNOSIS — I69118 Other symptoms and signs involving cognitive functions following nontraumatic intracerebral hemorrhage: Secondary | ICD-10-CM | POA: Diagnosis not present

## 2018-05-03 DIAGNOSIS — W19XXXD Unspecified fall, subsequent encounter: Secondary | ICD-10-CM | POA: Diagnosis not present

## 2018-05-03 DIAGNOSIS — Z9181 History of falling: Secondary | ICD-10-CM | POA: Diagnosis not present

## 2018-05-03 NOTE — Progress Notes (Signed)
POSTOP VISIT  POD # 63  Chief Complaint  Patient presents with  . Hip Pain    Right hip 03/05/18    HPI femoral neck fracture treated with a bipolar replacement doing well walking with a walker  Encounter Diagnosis  Name Primary?  . S/P hip replacement, right bipolar replacement due to frature 03/05/18 Yes    Hip flexion is normal leg lengths are equal  Postoperative plan (Work, WB, No orders of the defined types were placed in this encounter. ,FU)  X-ray in 6 months

## 2018-05-07 DIAGNOSIS — I10 Essential (primary) hypertension: Secondary | ICD-10-CM | POA: Diagnosis not present

## 2018-05-07 DIAGNOSIS — D649 Anemia, unspecified: Secondary | ICD-10-CM | POA: Diagnosis not present

## 2018-05-07 DIAGNOSIS — Z96641 Presence of right artificial hip joint: Secondary | ICD-10-CM | POA: Diagnosis not present

## 2018-05-07 DIAGNOSIS — I69118 Other symptoms and signs involving cognitive functions following nontraumatic intracerebral hemorrhage: Secondary | ICD-10-CM | POA: Diagnosis not present

## 2018-05-07 DIAGNOSIS — S72001D Fracture of unspecified part of neck of right femur, subsequent encounter for closed fracture with routine healing: Secondary | ICD-10-CM | POA: Diagnosis not present

## 2018-05-07 DIAGNOSIS — W19XXXD Unspecified fall, subsequent encounter: Secondary | ICD-10-CM | POA: Diagnosis not present

## 2018-05-09 ENCOUNTER — Ambulatory Visit: Payer: Self-pay | Admitting: *Deleted

## 2018-05-09 DIAGNOSIS — Z96641 Presence of right artificial hip joint: Secondary | ICD-10-CM | POA: Diagnosis not present

## 2018-05-09 DIAGNOSIS — S72001D Fracture of unspecified part of neck of right femur, subsequent encounter for closed fracture with routine healing: Secondary | ICD-10-CM | POA: Diagnosis not present

## 2018-05-09 DIAGNOSIS — I69118 Other symptoms and signs involving cognitive functions following nontraumatic intracerebral hemorrhage: Secondary | ICD-10-CM | POA: Diagnosis not present

## 2018-05-09 DIAGNOSIS — D649 Anemia, unspecified: Secondary | ICD-10-CM | POA: Diagnosis not present

## 2018-05-09 DIAGNOSIS — W19XXXD Unspecified fall, subsequent encounter: Secondary | ICD-10-CM | POA: Diagnosis not present

## 2018-05-09 DIAGNOSIS — I10 Essential (primary) hypertension: Secondary | ICD-10-CM | POA: Diagnosis not present

## 2018-05-13 DIAGNOSIS — D649 Anemia, unspecified: Secondary | ICD-10-CM | POA: Diagnosis not present

## 2018-05-13 DIAGNOSIS — W19XXXD Unspecified fall, subsequent encounter: Secondary | ICD-10-CM | POA: Diagnosis not present

## 2018-05-13 DIAGNOSIS — I69118 Other symptoms and signs involving cognitive functions following nontraumatic intracerebral hemorrhage: Secondary | ICD-10-CM | POA: Diagnosis not present

## 2018-05-13 DIAGNOSIS — Z96641 Presence of right artificial hip joint: Secondary | ICD-10-CM | POA: Diagnosis not present

## 2018-05-13 DIAGNOSIS — S72001D Fracture of unspecified part of neck of right femur, subsequent encounter for closed fracture with routine healing: Secondary | ICD-10-CM | POA: Diagnosis not present

## 2018-05-13 DIAGNOSIS — I10 Essential (primary) hypertension: Secondary | ICD-10-CM | POA: Diagnosis not present

## 2018-05-14 ENCOUNTER — Ambulatory Visit: Payer: Self-pay | Admitting: *Deleted

## 2018-05-14 DIAGNOSIS — S72001D Fracture of unspecified part of neck of right femur, subsequent encounter for closed fracture with routine healing: Secondary | ICD-10-CM | POA: Diagnosis not present

## 2018-05-14 DIAGNOSIS — I10 Essential (primary) hypertension: Secondary | ICD-10-CM | POA: Diagnosis not present

## 2018-05-14 DIAGNOSIS — D649 Anemia, unspecified: Secondary | ICD-10-CM | POA: Diagnosis not present

## 2018-05-14 DIAGNOSIS — I69118 Other symptoms and signs involving cognitive functions following nontraumatic intracerebral hemorrhage: Secondary | ICD-10-CM | POA: Diagnosis not present

## 2018-05-14 DIAGNOSIS — Z96641 Presence of right artificial hip joint: Secondary | ICD-10-CM | POA: Diagnosis not present

## 2018-05-14 DIAGNOSIS — W19XXXD Unspecified fall, subsequent encounter: Secondary | ICD-10-CM | POA: Diagnosis not present

## 2018-05-15 DIAGNOSIS — F411 Generalized anxiety disorder: Secondary | ICD-10-CM | POA: Diagnosis not present

## 2018-05-15 DIAGNOSIS — F418 Other specified anxiety disorders: Secondary | ICD-10-CM | POA: Diagnosis not present

## 2018-05-15 DIAGNOSIS — I10 Essential (primary) hypertension: Secondary | ICD-10-CM | POA: Diagnosis not present

## 2018-05-15 DIAGNOSIS — F0150 Vascular dementia without behavioral disturbance: Secondary | ICD-10-CM | POA: Diagnosis not present

## 2018-05-15 DIAGNOSIS — F0390 Unspecified dementia without behavioral disturbance: Secondary | ICD-10-CM | POA: Diagnosis not present

## 2018-05-15 DIAGNOSIS — M6281 Muscle weakness (generalized): Secondary | ICD-10-CM | POA: Diagnosis not present

## 2018-05-15 DIAGNOSIS — Z96641 Presence of right artificial hip joint: Secondary | ICD-10-CM | POA: Diagnosis not present

## 2018-05-15 DIAGNOSIS — K5900 Constipation, unspecified: Secondary | ICD-10-CM | POA: Diagnosis not present

## 2018-05-15 DIAGNOSIS — G894 Chronic pain syndrome: Secondary | ICD-10-CM | POA: Diagnosis not present

## 2018-05-15 DIAGNOSIS — W19XXXD Unspecified fall, subsequent encounter: Secondary | ICD-10-CM | POA: Diagnosis not present

## 2018-05-15 DIAGNOSIS — S72001D Fracture of unspecified part of neck of right femur, subsequent encounter for closed fracture with routine healing: Secondary | ICD-10-CM | POA: Diagnosis not present

## 2018-05-15 DIAGNOSIS — I69118 Other symptoms and signs involving cognitive functions following nontraumatic intracerebral hemorrhage: Secondary | ICD-10-CM | POA: Diagnosis not present

## 2018-05-15 DIAGNOSIS — D649 Anemia, unspecified: Secondary | ICD-10-CM | POA: Diagnosis not present

## 2018-05-15 DIAGNOSIS — D509 Iron deficiency anemia, unspecified: Secondary | ICD-10-CM | POA: Diagnosis not present

## 2018-05-16 DIAGNOSIS — Z96641 Presence of right artificial hip joint: Secondary | ICD-10-CM | POA: Diagnosis not present

## 2018-05-16 DIAGNOSIS — S72001D Fracture of unspecified part of neck of right femur, subsequent encounter for closed fracture with routine healing: Secondary | ICD-10-CM | POA: Diagnosis not present

## 2018-05-16 DIAGNOSIS — D649 Anemia, unspecified: Secondary | ICD-10-CM | POA: Diagnosis not present

## 2018-05-16 DIAGNOSIS — W19XXXD Unspecified fall, subsequent encounter: Secondary | ICD-10-CM | POA: Diagnosis not present

## 2018-05-16 DIAGNOSIS — I69118 Other symptoms and signs involving cognitive functions following nontraumatic intracerebral hemorrhage: Secondary | ICD-10-CM | POA: Diagnosis not present

## 2018-05-16 DIAGNOSIS — I10 Essential (primary) hypertension: Secondary | ICD-10-CM | POA: Diagnosis not present

## 2018-05-20 DIAGNOSIS — I10 Essential (primary) hypertension: Secondary | ICD-10-CM | POA: Diagnosis not present

## 2018-05-20 DIAGNOSIS — S72001D Fracture of unspecified part of neck of right femur, subsequent encounter for closed fracture with routine healing: Secondary | ICD-10-CM | POA: Diagnosis not present

## 2018-05-20 DIAGNOSIS — D649 Anemia, unspecified: Secondary | ICD-10-CM | POA: Diagnosis not present

## 2018-05-20 DIAGNOSIS — Z96641 Presence of right artificial hip joint: Secondary | ICD-10-CM | POA: Diagnosis not present

## 2018-05-20 DIAGNOSIS — I69118 Other symptoms and signs involving cognitive functions following nontraumatic intracerebral hemorrhage: Secondary | ICD-10-CM | POA: Diagnosis not present

## 2018-05-20 DIAGNOSIS — W19XXXD Unspecified fall, subsequent encounter: Secondary | ICD-10-CM | POA: Diagnosis not present

## 2018-05-21 DIAGNOSIS — Z96641 Presence of right artificial hip joint: Secondary | ICD-10-CM | POA: Diagnosis not present

## 2018-05-21 DIAGNOSIS — W19XXXD Unspecified fall, subsequent encounter: Secondary | ICD-10-CM | POA: Diagnosis not present

## 2018-05-21 DIAGNOSIS — S72001D Fracture of unspecified part of neck of right femur, subsequent encounter for closed fracture with routine healing: Secondary | ICD-10-CM | POA: Diagnosis not present

## 2018-05-21 DIAGNOSIS — D649 Anemia, unspecified: Secondary | ICD-10-CM | POA: Diagnosis not present

## 2018-05-21 DIAGNOSIS — I69118 Other symptoms and signs involving cognitive functions following nontraumatic intracerebral hemorrhage: Secondary | ICD-10-CM | POA: Diagnosis not present

## 2018-05-21 DIAGNOSIS — I10 Essential (primary) hypertension: Secondary | ICD-10-CM | POA: Diagnosis not present

## 2018-05-22 ENCOUNTER — Ambulatory Visit: Payer: Self-pay | Admitting: *Deleted

## 2018-05-23 DIAGNOSIS — D649 Anemia, unspecified: Secondary | ICD-10-CM | POA: Diagnosis not present

## 2018-05-23 DIAGNOSIS — W19XXXD Unspecified fall, subsequent encounter: Secondary | ICD-10-CM | POA: Diagnosis not present

## 2018-05-23 DIAGNOSIS — I69118 Other symptoms and signs involving cognitive functions following nontraumatic intracerebral hemorrhage: Secondary | ICD-10-CM | POA: Diagnosis not present

## 2018-05-23 DIAGNOSIS — S72001D Fracture of unspecified part of neck of right femur, subsequent encounter for closed fracture with routine healing: Secondary | ICD-10-CM | POA: Diagnosis not present

## 2018-05-23 DIAGNOSIS — Z96641 Presence of right artificial hip joint: Secondary | ICD-10-CM | POA: Diagnosis not present

## 2018-05-23 DIAGNOSIS — I10 Essential (primary) hypertension: Secondary | ICD-10-CM | POA: Diagnosis not present

## 2018-05-25 DIAGNOSIS — Z23 Encounter for immunization: Secondary | ICD-10-CM | POA: Diagnosis not present

## 2018-05-27 DIAGNOSIS — W19XXXD Unspecified fall, subsequent encounter: Secondary | ICD-10-CM | POA: Diagnosis not present

## 2018-05-27 DIAGNOSIS — I10 Essential (primary) hypertension: Secondary | ICD-10-CM | POA: Diagnosis not present

## 2018-05-27 DIAGNOSIS — D649 Anemia, unspecified: Secondary | ICD-10-CM | POA: Diagnosis not present

## 2018-05-27 DIAGNOSIS — S72001D Fracture of unspecified part of neck of right femur, subsequent encounter for closed fracture with routine healing: Secondary | ICD-10-CM | POA: Diagnosis not present

## 2018-05-27 DIAGNOSIS — Z96641 Presence of right artificial hip joint: Secondary | ICD-10-CM | POA: Diagnosis not present

## 2018-05-27 DIAGNOSIS — I69118 Other symptoms and signs involving cognitive functions following nontraumatic intracerebral hemorrhage: Secondary | ICD-10-CM | POA: Diagnosis not present

## 2018-05-28 DIAGNOSIS — I69118 Other symptoms and signs involving cognitive functions following nontraumatic intracerebral hemorrhage: Secondary | ICD-10-CM | POA: Diagnosis not present

## 2018-05-28 DIAGNOSIS — Z96641 Presence of right artificial hip joint: Secondary | ICD-10-CM | POA: Diagnosis not present

## 2018-05-28 DIAGNOSIS — D649 Anemia, unspecified: Secondary | ICD-10-CM | POA: Diagnosis not present

## 2018-05-28 DIAGNOSIS — I10 Essential (primary) hypertension: Secondary | ICD-10-CM | POA: Diagnosis not present

## 2018-05-28 DIAGNOSIS — W19XXXD Unspecified fall, subsequent encounter: Secondary | ICD-10-CM | POA: Diagnosis not present

## 2018-05-28 DIAGNOSIS — S72001D Fracture of unspecified part of neck of right femur, subsequent encounter for closed fracture with routine healing: Secondary | ICD-10-CM | POA: Diagnosis not present

## 2018-05-29 ENCOUNTER — Ambulatory Visit: Payer: Self-pay | Admitting: *Deleted

## 2018-05-29 DIAGNOSIS — S72001D Fracture of unspecified part of neck of right femur, subsequent encounter for closed fracture with routine healing: Secondary | ICD-10-CM | POA: Diagnosis not present

## 2018-05-29 DIAGNOSIS — Z96641 Presence of right artificial hip joint: Secondary | ICD-10-CM | POA: Diagnosis not present

## 2018-05-29 DIAGNOSIS — R609 Edema, unspecified: Secondary | ICD-10-CM | POA: Diagnosis not present

## 2018-05-29 DIAGNOSIS — D649 Anemia, unspecified: Secondary | ICD-10-CM | POA: Diagnosis not present

## 2018-05-29 DIAGNOSIS — I10 Essential (primary) hypertension: Secondary | ICD-10-CM | POA: Diagnosis not present

## 2018-05-29 DIAGNOSIS — W19XXXD Unspecified fall, subsequent encounter: Secondary | ICD-10-CM | POA: Diagnosis not present

## 2018-05-29 DIAGNOSIS — F418 Other specified anxiety disorders: Secondary | ICD-10-CM | POA: Diagnosis not present

## 2018-05-29 DIAGNOSIS — I69118 Other symptoms and signs involving cognitive functions following nontraumatic intracerebral hemorrhage: Secondary | ICD-10-CM | POA: Diagnosis not present

## 2018-05-29 DIAGNOSIS — F0390 Unspecified dementia without behavioral disturbance: Secondary | ICD-10-CM | POA: Diagnosis not present

## 2018-05-31 DIAGNOSIS — S72001D Fracture of unspecified part of neck of right femur, subsequent encounter for closed fracture with routine healing: Secondary | ICD-10-CM | POA: Diagnosis not present

## 2018-05-31 DIAGNOSIS — D649 Anemia, unspecified: Secondary | ICD-10-CM | POA: Diagnosis not present

## 2018-05-31 DIAGNOSIS — W19XXXD Unspecified fall, subsequent encounter: Secondary | ICD-10-CM | POA: Diagnosis not present

## 2018-05-31 DIAGNOSIS — I10 Essential (primary) hypertension: Secondary | ICD-10-CM | POA: Diagnosis not present

## 2018-05-31 DIAGNOSIS — Z96641 Presence of right artificial hip joint: Secondary | ICD-10-CM | POA: Diagnosis not present

## 2018-05-31 DIAGNOSIS — I69118 Other symptoms and signs involving cognitive functions following nontraumatic intracerebral hemorrhage: Secondary | ICD-10-CM | POA: Diagnosis not present

## 2018-06-05 DIAGNOSIS — Z96641 Presence of right artificial hip joint: Secondary | ICD-10-CM | POA: Diagnosis not present

## 2018-06-05 DIAGNOSIS — S72001D Fracture of unspecified part of neck of right femur, subsequent encounter for closed fracture with routine healing: Secondary | ICD-10-CM | POA: Diagnosis not present

## 2018-06-05 DIAGNOSIS — I69118 Other symptoms and signs involving cognitive functions following nontraumatic intracerebral hemorrhage: Secondary | ICD-10-CM | POA: Diagnosis not present

## 2018-06-05 DIAGNOSIS — W19XXXD Unspecified fall, subsequent encounter: Secondary | ICD-10-CM | POA: Diagnosis not present

## 2018-06-05 DIAGNOSIS — D649 Anemia, unspecified: Secondary | ICD-10-CM | POA: Diagnosis not present

## 2018-06-05 DIAGNOSIS — I10 Essential (primary) hypertension: Secondary | ICD-10-CM | POA: Diagnosis not present

## 2018-06-12 DIAGNOSIS — I10 Essential (primary) hypertension: Secondary | ICD-10-CM | POA: Diagnosis not present

## 2018-06-12 DIAGNOSIS — G4701 Insomnia due to medical condition: Secondary | ICD-10-CM | POA: Diagnosis not present

## 2018-06-12 DIAGNOSIS — K5900 Constipation, unspecified: Secondary | ICD-10-CM | POA: Diagnosis not present

## 2018-06-12 DIAGNOSIS — F0390 Unspecified dementia without behavioral disturbance: Secondary | ICD-10-CM | POA: Diagnosis not present

## 2018-06-12 DIAGNOSIS — R609 Edema, unspecified: Secondary | ICD-10-CM | POA: Diagnosis not present

## 2018-06-12 DIAGNOSIS — F418 Other specified anxiety disorders: Secondary | ICD-10-CM | POA: Diagnosis not present

## 2018-06-26 DIAGNOSIS — F418 Other specified anxiety disorders: Secondary | ICD-10-CM | POA: Diagnosis not present

## 2018-06-26 DIAGNOSIS — G4701 Insomnia due to medical condition: Secondary | ICD-10-CM | POA: Diagnosis not present

## 2018-06-26 DIAGNOSIS — G301 Alzheimer's disease with late onset: Secondary | ICD-10-CM | POA: Diagnosis not present

## 2018-06-26 DIAGNOSIS — F0280 Dementia in other diseases classified elsewhere without behavioral disturbance: Secondary | ICD-10-CM | POA: Diagnosis not present

## 2018-07-04 DIAGNOSIS — F0390 Unspecified dementia without behavioral disturbance: Secondary | ICD-10-CM | POA: Diagnosis not present

## 2018-07-04 DIAGNOSIS — F418 Other specified anxiety disorders: Secondary | ICD-10-CM | POA: Diagnosis not present

## 2018-07-10 DIAGNOSIS — R609 Edema, unspecified: Secondary | ICD-10-CM | POA: Diagnosis not present

## 2018-07-10 DIAGNOSIS — I10 Essential (primary) hypertension: Secondary | ICD-10-CM | POA: Diagnosis not present

## 2018-07-10 DIAGNOSIS — D509 Iron deficiency anemia, unspecified: Secondary | ICD-10-CM | POA: Diagnosis not present

## 2018-07-24 DIAGNOSIS — G894 Chronic pain syndrome: Secondary | ICD-10-CM | POA: Diagnosis not present

## 2018-07-24 DIAGNOSIS — G4701 Insomnia due to medical condition: Secondary | ICD-10-CM | POA: Diagnosis not present

## 2018-07-24 DIAGNOSIS — F0280 Dementia in other diseases classified elsewhere without behavioral disturbance: Secondary | ICD-10-CM | POA: Diagnosis not present

## 2018-07-24 DIAGNOSIS — F411 Generalized anxiety disorder: Secondary | ICD-10-CM | POA: Diagnosis not present

## 2018-07-24 DIAGNOSIS — D509 Iron deficiency anemia, unspecified: Secondary | ICD-10-CM | POA: Diagnosis not present

## 2018-07-24 DIAGNOSIS — K5900 Constipation, unspecified: Secondary | ICD-10-CM | POA: Diagnosis not present

## 2018-07-24 DIAGNOSIS — F418 Other specified anxiety disorders: Secondary | ICD-10-CM | POA: Diagnosis not present

## 2018-07-24 DIAGNOSIS — I10 Essential (primary) hypertension: Secondary | ICD-10-CM | POA: Diagnosis not present

## 2018-07-24 DIAGNOSIS — E559 Vitamin D deficiency, unspecified: Secondary | ICD-10-CM | POA: Diagnosis not present

## 2018-07-24 DIAGNOSIS — G301 Alzheimer's disease with late onset: Secondary | ICD-10-CM | POA: Diagnosis not present

## 2018-07-24 DIAGNOSIS — F0390 Unspecified dementia without behavioral disturbance: Secondary | ICD-10-CM | POA: Diagnosis not present

## 2018-08-03 DIAGNOSIS — I10 Essential (primary) hypertension: Secondary | ICD-10-CM | POA: Diagnosis not present

## 2018-08-03 DIAGNOSIS — F0150 Vascular dementia without behavioral disturbance: Secondary | ICD-10-CM | POA: Diagnosis not present

## 2018-08-07 DIAGNOSIS — R609 Edema, unspecified: Secondary | ICD-10-CM | POA: Diagnosis not present

## 2018-08-07 DIAGNOSIS — K5900 Constipation, unspecified: Secondary | ICD-10-CM | POA: Diagnosis not present

## 2018-08-07 DIAGNOSIS — I10 Essential (primary) hypertension: Secondary | ICD-10-CM | POA: Diagnosis not present

## 2018-08-21 DIAGNOSIS — E039 Hypothyroidism, unspecified: Secondary | ICD-10-CM | POA: Diagnosis not present

## 2018-08-21 DIAGNOSIS — G4701 Insomnia due to medical condition: Secondary | ICD-10-CM | POA: Diagnosis not present

## 2018-08-21 DIAGNOSIS — I10 Essential (primary) hypertension: Secondary | ICD-10-CM | POA: Diagnosis not present

## 2018-08-21 DIAGNOSIS — G301 Alzheimer's disease with late onset: Secondary | ICD-10-CM | POA: Diagnosis not present

## 2018-08-21 DIAGNOSIS — F0280 Dementia in other diseases classified elsewhere without behavioral disturbance: Secondary | ICD-10-CM | POA: Diagnosis not present

## 2018-08-21 DIAGNOSIS — F418 Other specified anxiety disorders: Secondary | ICD-10-CM | POA: Diagnosis not present

## 2018-08-21 DIAGNOSIS — E782 Mixed hyperlipidemia: Secondary | ICD-10-CM | POA: Diagnosis not present

## 2018-08-21 DIAGNOSIS — E559 Vitamin D deficiency, unspecified: Secondary | ICD-10-CM | POA: Diagnosis not present

## 2018-09-06 DIAGNOSIS — F411 Generalized anxiety disorder: Secondary | ICD-10-CM | POA: Diagnosis not present

## 2018-09-06 DIAGNOSIS — F0390 Unspecified dementia without behavioral disturbance: Secondary | ICD-10-CM | POA: Diagnosis not present

## 2018-09-06 DIAGNOSIS — M62838 Other muscle spasm: Secondary | ICD-10-CM | POA: Diagnosis not present

## 2018-09-06 DIAGNOSIS — R609 Edema, unspecified: Secondary | ICD-10-CM | POA: Diagnosis not present

## 2018-09-06 DIAGNOSIS — I10 Essential (primary) hypertension: Secondary | ICD-10-CM | POA: Diagnosis not present

## 2018-09-06 DIAGNOSIS — K5900 Constipation, unspecified: Secondary | ICD-10-CM | POA: Diagnosis not present

## 2018-09-06 DIAGNOSIS — D649 Anemia, unspecified: Secondary | ICD-10-CM | POA: Diagnosis not present

## 2018-09-06 DIAGNOSIS — G8929 Other chronic pain: Secondary | ICD-10-CM | POA: Diagnosis not present

## 2018-09-06 DIAGNOSIS — G4700 Insomnia, unspecified: Secondary | ICD-10-CM | POA: Diagnosis not present

## 2018-09-16 ENCOUNTER — Telehealth: Payer: Self-pay | Admitting: Orthopedic Surgery

## 2018-09-16 NOTE — Telephone Encounter (Signed)
[name] sister and power of attorney, [name] [phone] called to relay that patient has been doing very well with her hip; states would like to cancel appointment scheduled 11/03/08 for 27-month follow up Xrays. States due to patient's mental status since brain injury, that it is difficult to transport her to appointments, and that she does not deal well with attending appointments.  Okay to cancel?

## 2018-09-16 NOTE — Telephone Encounter (Signed)
OK CANCEL

## 2018-09-17 NOTE — Telephone Encounter (Signed)
Cancelled accordingly; aware.

## 2018-09-18 DIAGNOSIS — G4701 Insomnia due to medical condition: Secondary | ICD-10-CM | POA: Diagnosis not present

## 2018-09-18 DIAGNOSIS — G301 Alzheimer's disease with late onset: Secondary | ICD-10-CM | POA: Diagnosis not present

## 2018-09-18 DIAGNOSIS — F418 Other specified anxiety disorders: Secondary | ICD-10-CM | POA: Diagnosis not present

## 2018-09-18 DIAGNOSIS — F0631 Mood disorder due to known physiological condition with depressive features: Secondary | ICD-10-CM | POA: Diagnosis not present

## 2018-09-18 DIAGNOSIS — F0280 Dementia in other diseases classified elsewhere without behavioral disturbance: Secondary | ICD-10-CM | POA: Diagnosis not present

## 2018-09-25 DIAGNOSIS — F418 Other specified anxiety disorders: Secondary | ICD-10-CM | POA: Diagnosis not present

## 2018-09-25 DIAGNOSIS — G301 Alzheimer's disease with late onset: Secondary | ICD-10-CM | POA: Diagnosis not present

## 2018-09-25 DIAGNOSIS — F0280 Dementia in other diseases classified elsewhere without behavioral disturbance: Secondary | ICD-10-CM | POA: Diagnosis not present

## 2018-09-25 DIAGNOSIS — F0631 Mood disorder due to known physiological condition with depressive features: Secondary | ICD-10-CM | POA: Diagnosis not present

## 2018-09-25 DIAGNOSIS — G4701 Insomnia due to medical condition: Secondary | ICD-10-CM | POA: Diagnosis not present

## 2018-10-02 DIAGNOSIS — I10 Essential (primary) hypertension: Secondary | ICD-10-CM | POA: Diagnosis not present

## 2018-10-02 DIAGNOSIS — M6281 Muscle weakness (generalized): Secondary | ICD-10-CM | POA: Diagnosis not present

## 2018-10-02 DIAGNOSIS — D509 Iron deficiency anemia, unspecified: Secondary | ICD-10-CM | POA: Diagnosis not present

## 2018-10-23 DIAGNOSIS — G301 Alzheimer's disease with late onset: Secondary | ICD-10-CM | POA: Diagnosis not present

## 2018-10-23 DIAGNOSIS — G4701 Insomnia due to medical condition: Secondary | ICD-10-CM | POA: Diagnosis not present

## 2018-10-23 DIAGNOSIS — F0631 Mood disorder due to known physiological condition with depressive features: Secondary | ICD-10-CM | POA: Diagnosis not present

## 2018-10-23 DIAGNOSIS — F0280 Dementia in other diseases classified elsewhere without behavioral disturbance: Secondary | ICD-10-CM | POA: Diagnosis not present

## 2018-10-23 DIAGNOSIS — F418 Other specified anxiety disorders: Secondary | ICD-10-CM | POA: Diagnosis not present

## 2018-10-30 DIAGNOSIS — I10 Essential (primary) hypertension: Secondary | ICD-10-CM | POA: Diagnosis not present

## 2018-10-30 DIAGNOSIS — R609 Edema, unspecified: Secondary | ICD-10-CM | POA: Diagnosis not present

## 2018-10-30 DIAGNOSIS — K5900 Constipation, unspecified: Secondary | ICD-10-CM | POA: Diagnosis not present

## 2018-11-04 ENCOUNTER — Ambulatory Visit: Payer: Medicare Other | Admitting: Orthopedic Surgery

## 2018-11-05 DIAGNOSIS — R1110 Vomiting, unspecified: Secondary | ICD-10-CM | POA: Diagnosis not present

## 2018-11-05 DIAGNOSIS — I639 Cerebral infarction, unspecified: Secondary | ICD-10-CM | POA: Diagnosis not present

## 2018-11-06 ENCOUNTER — Emergency Department (HOSPITAL_COMMUNITY)

## 2018-11-06 ENCOUNTER — Inpatient Hospital Stay (HOSPITAL_COMMUNITY)
Admission: EM | Admit: 2018-11-06 | Discharge: 2018-11-07 | DRG: 065 | Disposition: A | Discharge status: Hospice | Source: Skilled Nursing Facility | Attending: Family Medicine | Admitting: Family Medicine

## 2018-11-06 ENCOUNTER — Encounter (HOSPITAL_COMMUNITY): Payer: Self-pay

## 2018-11-06 ENCOUNTER — Other Ambulatory Visit: Payer: Self-pay

## 2018-11-06 DIAGNOSIS — S06340A Traumatic hemorrhage of right cerebrum without loss of consciousness, initial encounter: Secondary | ICD-10-CM | POA: Diagnosis not present

## 2018-11-06 DIAGNOSIS — R402123 Coma scale, eyes open, to pain, at hospital admission: Secondary | ICD-10-CM | POA: Diagnosis present

## 2018-11-06 DIAGNOSIS — I619 Nontraumatic intracerebral hemorrhage, unspecified: Secondary | ICD-10-CM | POA: Diagnosis present

## 2018-11-06 DIAGNOSIS — Z7189 Other specified counseling: Secondary | ICD-10-CM | POA: Diagnosis not present

## 2018-11-06 DIAGNOSIS — I611 Nontraumatic intracerebral hemorrhage in hemisphere, cortical: Secondary | ICD-10-CM | POA: Diagnosis not present

## 2018-11-06 DIAGNOSIS — R531 Weakness: Secondary | ICD-10-CM | POA: Diagnosis not present

## 2018-11-06 DIAGNOSIS — Z7982 Long term (current) use of aspirin: Secondary | ICD-10-CM

## 2018-11-06 DIAGNOSIS — Z8249 Family history of ischemic heart disease and other diseases of the circulatory system: Secondary | ICD-10-CM

## 2018-11-06 DIAGNOSIS — M79642 Pain in left hand: Secondary | ICD-10-CM | POA: Diagnosis not present

## 2018-11-06 DIAGNOSIS — R402213 Coma scale, best verbal response, none, at hospital admission: Secondary | ICD-10-CM | POA: Diagnosis present

## 2018-11-06 DIAGNOSIS — Z8673 Personal history of transient ischemic attack (TIA), and cerebral infarction without residual deficits: Secondary | ICD-10-CM

## 2018-11-06 DIAGNOSIS — Z96641 Presence of right artificial hip joint: Secondary | ICD-10-CM | POA: Diagnosis present

## 2018-11-06 DIAGNOSIS — I10 Essential (primary) hypertension: Secondary | ICD-10-CM | POA: Diagnosis present

## 2018-11-06 DIAGNOSIS — S6992XA Unspecified injury of left wrist, hand and finger(s), initial encounter: Secondary | ICD-10-CM | POA: Diagnosis not present

## 2018-11-06 DIAGNOSIS — R402343 Coma scale, best motor response, flexion withdrawal, at hospital admission: Secondary | ICD-10-CM | POA: Diagnosis present

## 2018-11-06 DIAGNOSIS — S06380A Contusion, laceration, and hemorrhage of brainstem without loss of consciousness, initial encounter: Secondary | ICD-10-CM | POA: Diagnosis not present

## 2018-11-06 DIAGNOSIS — I615 Nontraumatic intracerebral hemorrhage, intraventricular: Secondary | ICD-10-CM | POA: Diagnosis present

## 2018-11-06 DIAGNOSIS — I639 Cerebral infarction, unspecified: Secondary | ICD-10-CM | POA: Diagnosis not present

## 2018-11-06 DIAGNOSIS — S8002XA Contusion of left knee, initial encounter: Secondary | ICD-10-CM | POA: Diagnosis not present

## 2018-11-06 DIAGNOSIS — Z515 Encounter for palliative care: Secondary | ICD-10-CM | POA: Insufficient documentation

## 2018-11-06 DIAGNOSIS — M1990 Unspecified osteoarthritis, unspecified site: Secondary | ICD-10-CM | POA: Diagnosis not present

## 2018-11-06 DIAGNOSIS — R1110 Vomiting, unspecified: Secondary | ICD-10-CM | POA: Diagnosis not present

## 2018-11-06 DIAGNOSIS — G9349 Other encephalopathy: Secondary | ICD-10-CM | POA: Diagnosis present

## 2018-11-06 DIAGNOSIS — S3991XA Unspecified injury of abdomen, initial encounter: Secondary | ICD-10-CM | POA: Diagnosis not present

## 2018-11-06 DIAGNOSIS — F0390 Unspecified dementia without behavioral disturbance: Secondary | ICD-10-CM | POA: Diagnosis present

## 2018-11-06 DIAGNOSIS — Z66 Do not resuscitate: Secondary | ICD-10-CM | POA: Diagnosis present

## 2018-11-06 LAB — CBC
HCT: 37.9 % (ref 36.0–46.0)
HEMOGLOBIN: 11.8 g/dL — AB (ref 12.0–15.0)
MCH: 28.6 pg (ref 26.0–34.0)
MCHC: 31.1 g/dL (ref 30.0–36.0)
MCV: 91.8 fL (ref 80.0–100.0)
Platelets: 242 10*3/uL (ref 150–400)
RBC: 4.13 MIL/uL (ref 3.87–5.11)
RDW: 13.6 % (ref 11.5–15.5)
WBC: 14 10*3/uL — ABNORMAL HIGH (ref 4.0–10.5)
nRBC: 0 % (ref 0.0–0.2)

## 2018-11-06 LAB — URINALYSIS, ROUTINE W REFLEX MICROSCOPIC
Bacteria, UA: NONE SEEN
Bilirubin Urine: NEGATIVE
Glucose, UA: 150 mg/dL — AB
Ketones, ur: 5 mg/dL — AB
Leukocytes,Ua: NEGATIVE
Nitrite: NEGATIVE
Protein, ur: NEGATIVE mg/dL
SPECIFIC GRAVITY, URINE: 1.009 (ref 1.005–1.030)
pH: 8 (ref 5.0–8.0)

## 2018-11-06 LAB — COMPREHENSIVE METABOLIC PANEL
ALBUMIN: 3.9 g/dL (ref 3.5–5.0)
ALK PHOS: 97 U/L (ref 38–126)
ALT: 13 U/L (ref 0–44)
AST: 24 U/L (ref 15–41)
Anion gap: 11 (ref 5–15)
BILIRUBIN TOTAL: 0.5 mg/dL (ref 0.3–1.2)
BUN: 16 mg/dL (ref 8–23)
CALCIUM: 9 mg/dL (ref 8.9–10.3)
CO2: 25 mmol/L (ref 22–32)
CREATININE: 0.7 mg/dL (ref 0.44–1.00)
Chloride: 99 mmol/L (ref 98–111)
GFR calc Af Amer: >60 mL/min (ref >60)
GFR calc non Af Amer: >60 mL/min (ref >60)
GLUCOSE: 198 mg/dL — AB (ref 70–99)
POTASSIUM: 3.6 mmol/L (ref 3.5–5.1)
Sodium: 135 mmol/L (ref 135–145)
Total Protein: 6.8 g/dL (ref 6.5–8.1)

## 2018-11-06 LAB — LIPASE, BLOOD: Lipase: 24 U/L (ref 11–51)

## 2018-11-06 LAB — MRSA PCR SCREENING: MRSA by PCR: NEGATIVE

## 2018-11-06 MED ORDER — HYDRALAZINE HCL 20 MG/ML IJ SOLN
10.0000 mg | Freq: Four times a day (QID) | INTRAMUSCULAR | Status: DC | PRN
  Administered 2018-11-06: 10 mg via INTRAVENOUS

## 2018-11-06 MED ORDER — POLYETHYLENE GLYCOL 3350 17 G PO PACK: 17.0000 g | PACK | Freq: Every day | ORAL | Status: DC | PRN

## 2018-11-06 MED ORDER — LORAZEPAM 2 MG/ML IJ SOLN: 1.0000 mg | INTRAMUSCULAR | Status: DC | PRN

## 2018-11-06 MED ORDER — BISACODYL 10 MG RE SUPP: 10.0000 mg | Freq: Every day | RECTAL | Status: DC | PRN

## 2018-11-06 MED ORDER — ACETAMINOPHEN 325 MG PO TABS: 650.0000 mg | ORAL_TABLET | Freq: Four times a day (QID) | ORAL | Status: DC | PRN

## 2018-11-06 MED ORDER — SODIUM CHLORIDE 0.9% FLUSH: 3.0000 mL | INTRAVENOUS | Status: DC | PRN

## 2018-11-06 MED ORDER — ACETAMINOPHEN 650 MG RE SUPP
650.0000 mg | Freq: Four times a day (QID) | RECTAL | Status: DC | PRN
  Administered 2018-11-06: 650 mg via RECTAL
  Filled 2018-11-06: qty 1

## 2018-11-06 MED ORDER — SODIUM CHLORIDE 0.9 % IV SOLN
INTRAVENOUS | Status: DC | PRN
  Administered 2018-11-06: 1000 mL via INTRAVENOUS

## 2018-11-06 MED ORDER — ONDANSETRON HCL 4 MG/2ML IJ SOLN: 4.0000 mg | Freq: Four times a day (QID) | INTRAMUSCULAR | Status: DC | PRN

## 2018-11-06 MED ORDER — LORAZEPAM 2 MG/ML PO CONC: 1.0000 mg | ORAL | Status: DC | PRN

## 2018-11-06 MED ORDER — ALBUTEROL SULFATE (2.5 MG/3ML) 0.083% IN NEBU: 2.5000 mg | INHALATION_SOLUTION | RESPIRATORY_TRACT | Status: DC | PRN

## 2018-11-06 MED ORDER — ONDANSETRON HCL 4 MG/2ML IJ SOLN
4.0000 mg | Freq: Four times a day (QID) | INTRAMUSCULAR | Status: DC | PRN
  Administered 2018-11-06: 4 mg via INTRAVENOUS
  Filled 2018-11-06: qty 2

## 2018-11-06 MED ORDER — TRAZODONE HCL 50 MG PO TABS: 50.0000 mg | ORAL_TABLET | Freq: Every evening | ORAL | Status: DC | PRN

## 2018-11-06 MED ORDER — LORAZEPAM 1 MG PO TABS: 1.0000 mg | ORAL_TABLET | ORAL | Status: DC | PRN

## 2018-11-06 MED ORDER — BIOTENE DRY MOUTH MT LIQD: 15.0000 mL | OROMUCOSAL | Status: DC | PRN

## 2018-11-06 MED ORDER — SODIUM CHLORIDE 0.9 % IV SOLN: 250.0000 mL | INTRAVENOUS | Status: DC | PRN

## 2018-11-06 MED ORDER — ACETAMINOPHEN 650 MG RE SUPP: 650.0000 mg | Freq: Four times a day (QID) | RECTAL | Status: DC | PRN

## 2018-11-06 MED ORDER — SODIUM CHLORIDE 0.9% FLUSH
3.0000 mL | Freq: Once | INTRAVENOUS | Status: AC
  Administered 2018-11-06: 3 mL via INTRAVENOUS

## 2018-11-06 MED ORDER — ONDANSETRON HCL 4 MG PO TABS: 4.0000 mg | ORAL_TABLET | Freq: Four times a day (QID) | ORAL | Status: DC | PRN

## 2018-11-06 MED ORDER — SODIUM CHLORIDE 0.9% FLUSH
3.0000 mL | Freq: Two times a day (BID) | INTRAVENOUS | Status: DC
  Administered 2018-11-06: 3 mL via INTRAVENOUS

## 2018-11-06 MED ORDER — ONDANSETRON 4 MG PO TBDP: 4.0000 mg | ORAL_TABLET | Freq: Four times a day (QID) | ORAL | Status: DC | PRN

## 2018-11-06 MED ORDER — HYDRALAZINE HCL 20 MG/ML IJ SOLN
INTRAMUSCULAR | Status: AC
  Filled 2018-11-06: qty 1

## 2018-11-06 MED ORDER — ONDANSETRON HCL 4 MG/2ML IJ SOLN
4.0000 mg | Freq: Once | INTRAMUSCULAR | Status: AC
  Administered 2018-11-06: 4 mg via INTRAVENOUS
  Filled 2018-11-06: qty 2

## 2018-11-06 MED ORDER — POLYVINYL ALCOHOL 1.4 % OP SOLN
1.0000 [drp] | Freq: Four times a day (QID) | OPHTHALMIC | Status: DC | PRN
  Filled 2018-11-06: qty 15

## 2018-11-06 MED ORDER — LOPERAMIDE HCL 2 MG PO CAPS: 2.0000 mg | ORAL_CAPSULE | ORAL | Status: DC | PRN

## 2018-11-06 MED ORDER — MORPHINE SULFATE (PF) 2 MG/ML IV SOLN
2.0000 mg | INTRAVENOUS | Status: DC | PRN
  Administered 2018-11-06: 2 mg via INTRAVENOUS
  Filled 2018-11-06: qty 1

## 2018-11-06 MED ORDER — LORAZEPAM 2 MG/ML IJ SOLN
1.0000 mg | INTRAMUSCULAR | Status: DC | PRN
  Administered 2018-11-06: 1 mg via INTRAVENOUS
  Filled 2018-11-06: qty 1

## 2018-11-06 MED ORDER — SODIUM CHLORIDE 0.9 % IV BOLUS
500.0000 mL | Freq: Once | INTRAVENOUS | Status: AC
  Administered 2018-11-06: 500 mL via INTRAVENOUS

## 2018-11-06 MED ORDER — SODIUM CHLORIDE 0.9 % IV SOLN
400.0000 mg | Freq: Once | INTRAVENOUS | Status: AC
  Administered 2018-11-06: 400 mg via INTRAVENOUS
  Filled 2018-11-06: qty 4

## 2018-11-06 NOTE — ED Notes (Signed)
Placed pt on bedpan, she states that she feels that she has to void

## 2018-11-06 NOTE — H&P (Signed)
Patient Demographics:    Chelsea Pratt, is a 85 y.o. female  MRN: [id number]   DOB - May 01, 1930  Admit Date - 11/06/2018  Outpatient Primary MD for the patient is Celene Squibb, MD   Assessment & Plan:    Active Problems:   ICH (intracerebral hemorrhage) (HCC)    1)Rt Sided ICH--- CT head with Large right parietal hemorrhage with intraventricular extension and 11 mm right to left midline shift.  Aspirin has been discontinued Face to Face Conference with Pt's sister and pt's Brother as well as Ms Hulan Fray (Palliative) ... [name] 5 year old husband is currently on home hospice care, patient has a living will, patient previously had evacuation of hematoma after large ICH on April 18, 2010... At this time patient's husband, patient's brother, patient's sister and patient does not want any neurosurgical intervention--- they are requesting palliative/hospice care.. Patient is full comfort measures.  Residential hospice overall care at home apparently does not have a bed at this time, patient will be brought into the hospital until a bed opens up at residential hospice facility--- comfort care/hospice orders initiated.  Prognosis is grave, expected life expectancy less than 2 weeks.    2)Social/Ethics--- DNR/DNI, patient sister (Ms [name] [id number]---[phone]) is the primary decision maker.. Prognosis is grave, expected life expectancy less than 2 weeks.  Anticipate transfer to residential hospice when bed opens up, IV morphine sulfate and lorazepam as ordered, palliative care and social work consults/input appreciated  3)HTN--given ICH use IV hydralazine as needed elevated BP  4)FEN--- comfort feeding as desired  With History of - Reviewed by me  Past Medical History:  Diagnosis Date  . HTN (hypertension)   . Stroke  [facility])       Past Surgical History:  Procedure Laterality Date  . BRAIN SURGERY    . HIP ARTHROPLASTY Right 03/05/2018   Procedure: ARTHROPLASTY BIPOLAR HIP (HEMIARTHROPLASTY);  Surgeon: Carole Civil, MD;  Location: [facility];  Service: Orthopedics;  Laterality: Right;      Chief Complaint  Patient presents with  . Nausea  . Fall      HPI:    Chelsea Pratt  is a 85 y.o. female with prior history of ICH back in April 08, 2010 with subsequent brain surgery, history of hypertension and dementia... Presents from [facility] [place] assisted living facility with headaches, vomiting and possible fall with bruising to her left knee and left hand--- she was found on the floor  Patient is a poor historian  Additional history obtained from patient's sister and brother at bedside  In ED--CT head with ICH--CT head with Large right parietal hemorrhage with intraventricular extension and 11 mm right to left midline shift.  Face to Face Conference with Pt's sister and pt's Brother as well as Ms Hulan Fray (Palliative) ... [name] 32 year old husband is currently on home hospice care, patient has a living will, patient previously had evacuation of hematoma after large ICH on April 18, 2010... At this  time patient's husband, patient's brother, patient's sister and patient does not want any neurosurgical intervention--- requesting palliative/hospice care..   EDP discussed with neurosurgeon at [facility] who advised against transfer as patient and family does not want neurosurgical intervention  One episode of loose stools in the ED    Review of systems:    In addition to the HPI above,   A full Review of  Systems was done, all other systems reviewed are negative except as noted above in HPI , .    Social History:  Reviewed by me    Social History   Tobacco Use  . Smoking status: Never Smoker  . Smokeless tobacco: Never Used  Substance Use Topics  . Alcohol use: No       Family History :    Reviewed by me    Family History  Problem Relation Age of Onset  . Heart attack Father     Home Medications:   Prior to Admission medications   Medication Sig Start Date End Date Taking? Authorizing Provider  acetaminophen (TYLENOL) 500 MG tablet Take 500 mg by mouth every 6 (six) hours as needed.    [provider]  aspirin EC 81 MG tablet Take 81 mg by mouth daily. Starting 04/05/2018    [provider]  cyclobenzaprine (FLEXERIL) 5 MG tablet Take 1 tablet (5 mg total) by mouth 3 (three) times daily as needed for muscle spasms. 03/09/15   Orpah Greek, MD  diclofenac sodium (VOLTAREN) 1 % GEL Apply 4 g topically every 6 (six) hours. Apply to right hip for pain not to exceed 32 grams daily to all joints    [provider]  docusate sodium (COLACE) 100 MG capsule Take 1 capsule (100 mg total) by mouth 2 (two) times daily. 03/07/18   Manuella Ghazi, Pratik D, DO  losartan (COZAAR) 25 MG tablet Take 25 mg by mouth daily as needed (when blood pressure is over 140).     [provider]  senna-docusate (SENOKOT-S) 8.6-50 MG tablet Take 1 tablet by mouth at bedtime as needed for mild constipation. 03/07/18   Manuella Ghazi, Pratik D, DO     Allergies:    No Known Allergies   Physical Exam:   Vitals  Blood pressure (!) 173/78, pulse 86, temperature 98.1 F (36.7 C), temperature source Oral, resp. rate 16, weight 72.6 kg, SpO2 98 %.  Physical Examination: General appearance -awake, in no acute distress Mental status -underlying dementia with cognitive deficits now altered mentation/encephalopathy due to [place] Eyes - sclera anicteric Neck - supple, no JVD elevation , Chest - clear  to auscultation bilaterally, symmetrical air movement,  Heart - S1 and S2 normal, regular  Abdomen - soft, nontender, nondistended, no masses or organomegaly Neurological -Limited exam due to underlying dementia and superimposed encephalopathy secondary to [place]  extremities - no pedal edema  noted, intact peripheral pulses, left arm and left knee ecchymosis presumably from fall Skin - warm, dry     Data Review:    CBC Recent Labs  Lab 11/06/18 0748  WBC 14.0*  HGB 11.8*  HCT 37.9  PLT 242  MCV 91.8  MCH 28.6  MCHC 31.1  RDW 13.6   ------------------------------------------------------------------------------------------------------------------  Chemistries  Recent Labs  Lab 11/06/18 0748  NA 135  K 3.6  CL 99  CO2 25  GLUCOSE 198*  BUN 16  CREATININE 0.70  CALCIUM 9.0  AST 24  ALT 13  ALKPHOS 97  BILITOT 0.5   ------------------------------------------------------------------------------------------------------------------  CrCl cannot be calculated (Unknown ideal weight.). ------------------------------------------------------------------------------------------------------------------ No results for input(s): TSH, T4TOTAL, T3FREE, THYROIDAB in the last 72 hours.  Invalid input(s): FREET3   Coagulation profile No results for input(s): INR, PROTIME in the last 168 hours. ------------------------------------------------------------------------------------------------------------------- No results for input(s): DDIMER in the last 72 hours. -------------------------------------------------------------------------------------------------------------------  Cardiac Enzymes No results for input(s): CKMB, TROPONINI, MYOGLOBIN in the last 168 hours.  Invalid input(s): CK ------------------------------------------------------------------------------------------------------------------ No results found for: BNP   ---------------------------------------------------------------------------------------------------------------  Urinalysis    Component Value Date/Time   COLORURINE STRAW (A) 11/06/2018 0732   APPEARANCEUR CLEAR 11/06/2018 0732   LABSPEC 1.009 11/06/2018 0732   PHURINE 8.0 11/06/2018 0732   GLUCOSEU 150 (A) 11/06/2018 0732   HGBUR  SMALL (A) 11/06/2018 0732   BILIRUBINUR NEGATIVE 11/06/2018 0732   KETONESUR 5 (A) 11/06/2018 0732   PROTEINUR NEGATIVE 11/06/2018 0732   NITRITE NEGATIVE 11/06/2018 0732   LEUKOCYTESUR NEGATIVE 11/06/2018 0732    ----------------------------------------------------------------------------------------------------------------   Imaging Results:    Ct Head Wo Contrast  Result Date: 11/06/2018 CLINICAL DATA:  Vomiting, found on floor. EXAM: CT HEAD WITHOUT CONTRAST TECHNIQUE: Contiguous axial images were obtained from the base of the skull through the vertex without intravenous contrast. COMPARISON:  06/24/2010 FINDINGS: Brain: Large right posterior parietal hemorrhage measures 5.4 x 4.6 cm. Surrounding vasogenic edema. Extension of hemorrhage into the ventricular system with large amount of right lateral ventricular blood. Blood also noted in the 3rd ventricle and 4th ventricle. No hydrocephalus currently. There is 11 mm of right to left midline shift. Old infarcts noted in the right temporal and parietal lobes. There is atrophy and chronic small vessel disease changes. Vascular: No hyperdense vessel or unexpected calcification. Skull: No acute calvarial abnormality. Sinuses/Orbits: Visualized paranasal sinuses and mastoids clear. Orbital soft tissues unremarkable. Other: None IMPRESSION: Large right parietal hemorrhage with intraventricular extension and 11 mm right to left midline shift. Old right temporoparietal infarct. Critical Value/emergent results were called by telephone at the time of interpretation on 11/06/2018 at 9:23 am to Dr. Davonna Belling , who verbally acknowledged these results. Atrophy, chronic small vessel disease. Electronically Signed   By: Rolm Baptise M.D.   On: 11/06/2018 09:25   Dg Knee Complete 4 Views Left  Result Date: 11/06/2018 CLINICAL DATA:  Left knee swelling and bruising secondary to a fall. EXAM: LEFT KNEE - COMPLETE 4+ VIEW COMPARISON:  01/15/2013 FINDINGS: There  is no fracture or joint effusion. Moderate arthritis in the patellofemoral compartment, unchanged. There is soft tissue swelling anterior to the knee. Faint chondrocalcinosis. Minimal marginal osteophytes in the lateral compartment. IMPRESSION: 1. No acute bone abnormality. 2. Soft tissue swelling anteriorly. Electronically Signed   By: Lorriane Shire M.D.   On: 11/06/2018 09:23   Dg Abd Acute 2+v W 1v Chest  Result Date: 11/06/2018 CLINICAL DATA:  Abdominal discomfort secondary to a fall. EXAM: DG ABDOMEN ACUTE W/ 1V CHEST COMPARISON:  Chest x-ray dated 03/07/2018 and lumbar radiographs dated 03/09/2015 FINDINGS: There is no evidence of dilated bowel loops or free intraperitoneal air. No radiopaque calculi or other significant radiographic abnormality is seen. Heart size and mediastinal contours are within normal limits. Both lungs are clear. Chronic slight lobulation of the left hemidiaphragm contour. IMPRESSION: Negative abdominal radiographs.  No acute cardiopulmonary disease. Electronically Signed   By: Lorriane Shire M.D.   On: 11/06/2018 09:26   Dg Hand Complete Left  Result Date: 11/06/2018 CLINICAL DATA:  Left hand pain secondary to a fall. EXAM: LEFT HAND - COMPLETE 3+  VIEW COMPARISON:  None. FINDINGS: There is no fracture or dislocation. There is severe arthritis of the first [facility] joint. Joint space narrowing at the PIP joint of the little finger. Prominent degenerative cyst in the scaphoid. IMPRESSION: No acute abnormality.  Degenerative changes as described. Electronically Signed   By: Lorriane Shire M.D.   On: 11/06/2018 09:22    Radiological Exams on Admission: Ct Head Wo Contrast  Result Date: 11/06/2018 CLINICAL DATA:  Vomiting, found on floor. EXAM: CT HEAD WITHOUT CONTRAST TECHNIQUE: Contiguous axial images were obtained from the base of the skull through the vertex without intravenous contrast. COMPARISON:  06/24/2010 FINDINGS: Brain: Large right posterior parietal hemorrhage measures  5.4 x 4.6 cm. Surrounding vasogenic edema. Extension of hemorrhage into the ventricular system with large amount of right lateral ventricular blood. Blood also noted in the 3rd ventricle and 4th ventricle. No hydrocephalus currently. There is 11 mm of right to left midline shift. Old infarcts noted in the right temporal and parietal lobes. There is atrophy and chronic small vessel disease changes. Vascular: No hyperdense vessel or unexpected calcification. Skull: No acute calvarial abnormality. Sinuses/Orbits: Visualized paranasal sinuses and mastoids clear. Orbital soft tissues unremarkable. Other: None IMPRESSION: Large right parietal hemorrhage with intraventricular extension and 11 mm right to left midline shift. Old right temporoparietal infarct. Critical Value/emergent results were called by telephone at the time of interpretation on 11/06/2018 at 9:23 am to Dr. Davonna Belling , who verbally acknowledged these results. Atrophy, chronic small vessel disease. Electronically Signed   By: Rolm Baptise M.D.   On: 11/06/2018 09:25   Dg Knee Complete 4 Views Left  Result Date: 11/06/2018 CLINICAL DATA:  Left knee swelling and bruising secondary to a fall. EXAM: LEFT KNEE - COMPLETE 4+ VIEW COMPARISON:  01/15/2013 FINDINGS: There is no fracture or joint effusion. Moderate arthritis in the patellofemoral compartment, unchanged. There is soft tissue swelling anterior to the knee. Faint chondrocalcinosis. Minimal marginal osteophytes in the lateral compartment. IMPRESSION: 1. No acute bone abnormality. 2. Soft tissue swelling anteriorly. Electronically Signed   By: Lorriane Shire M.D.   On: 11/06/2018 09:23   Dg Abd Acute 2+v W 1v Chest  Result Date: 11/06/2018 CLINICAL DATA:  Abdominal discomfort secondary to a fall. EXAM: DG ABDOMEN ACUTE W/ 1V CHEST COMPARISON:  Chest x-ray dated 03/07/2018 and lumbar radiographs dated 03/09/2015 FINDINGS: There is no evidence of dilated bowel loops or free intraperitoneal air.  No radiopaque calculi or other significant radiographic abnormality is seen. Heart size and mediastinal contours are within normal limits. Both lungs are clear. Chronic slight lobulation of the left hemidiaphragm contour. IMPRESSION: Negative abdominal radiographs.  No acute cardiopulmonary disease. Electronically Signed   By: Lorriane Shire M.D.   On: 11/06/2018 09:26   Dg Hand Complete Left  Result Date: 11/06/2018 CLINICAL DATA:  Left hand pain secondary to a fall. EXAM: LEFT HAND - COMPLETE 3+ VIEW COMPARISON:  None. FINDINGS: There is no fracture or dislocation. There is severe arthritis of the first [facility] joint. Joint space narrowing at the PIP joint of the little finger. Prominent degenerative cyst in the scaphoid. IMPRESSION: No acute abnormality.  Degenerative changes as described. Electronically Signed   By: Lorriane Shire M.D.   On: 11/06/2018 09:22    DVT Prophylaxis -SCD  AM Labs Ordered, also please review Full Orders  Family Communication: Admission, patients condition and plan of care including tests being ordered have been discussed with the patient and sister and brother at bedside* who  indicate understanding and agree with the plan   Code Status - DNR  Likely DC to residential hospice  Condition   grave prognosis  Roxan Hockey M.D on 11/06/2018 at 11:34 AM Go to [email or web address] -  for contact info  [facility] - Office  [phone]

## 2018-11-06 NOTE — ED Provider Notes (Signed)
[facility] EMERGENCY DEPARTMENT Provider Note   CSN: [id number] Arrival date & time: 11/06/18  0710    History   Chief Complaint Chief Complaint  Patient presents with  . Nausea  . Fall    HPI Chelsea Pratt is a 85 y.o. female.    Level 5 caveat due to dementia. HPI Patient reportedly found on the floor.  Has been vomiting overnight.  Patient states she feels nauseous.  States she just does not feel good.  Has a dull headache.  Has bruising to her left hand and left knee.  Patient does not know how she ended up on the floor.  Does have history of some mental status changes since intracranial hemorrhage. Past Medical History:  Diagnosis Date  . HTN (hypertension)   . Stroke [facility])     Patient Active Problem List   Diagnosis Date Noted  . 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 Sepsis secondary to UTI ([place]) 03/07/2018  . HTN (hypertension) 03/07/2018  . Anemia 03/07/2018  . Hypoalbuminemia 03/07/2018  . Closed right hip fracture ([place]) 03/04/2018  . 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 Intracerebral hemorrhage ([place]) 03/12/2013  . Osteoarthritis of both knees 01/15/2013    Past Surgical History:  Procedure Laterality Date  . BRAIN SURGERY    . HIP ARTHROPLASTY Right 03/05/2018   Procedure: ARTHROPLASTY BIPOLAR HIP (HEMIARTHROPLASTY);  Surgeon: Carole Civil, MD;  Location: [facility];  Service: Orthopedics;  Laterality: Right;     OB History    Gravida      Para      Term      Preterm      AB      Living  0     SAB      TAB      Ectopic      Multiple      Live Births               Home Medications    Prior to Admission medications   Medication Sig Start Date End Date Taking? Authorizing Provider  acetaminophen (TYLENOL) 500 MG tablet Take 500 mg by mouth every 6 (six) hours as needed.    [provider]  aspirin EC 81 MG tablet Take 81 mg by mouth daily.  Starting 04/05/2018    [provider]  cyclobenzaprine (FLEXERIL) 5 MG tablet Take 1 tablet (5 mg total) by mouth 3 (three) times daily as needed for muscle spasms. 03/09/15   Orpah Greek, MD  diclofenac sodium (VOLTAREN) 1 % GEL Apply 4 g topically every 6 (six) hours. Apply to right hip for pain not to exceed 32 grams daily to all joints    [provider]  docusate sodium (COLACE) 100 MG capsule Take 1 capsule (100 mg total) by mouth 2 (two) times daily. 03/07/18   Manuella Ghazi, Pratik D, DO  losartan (COZAAR) 25 MG tablet Take 25 mg by mouth daily as needed (when blood pressure is over 140).     [provider]  senna-docusate (SENOKOT-S) 8.6-50 MG tablet Take 1 tablet by mouth at bedtime as needed for mild constipation. 03/07/18   Heath Lark D, DO    Family History Family History  Problem Relation Age of Onset  . Heart attack Father     Social History Social History   Tobacco Use  . Smoking status: Never Smoker  . Smokeless tobacco: Never Used  Substance Use  Topics  . Alcohol use: No  . Drug use: No     Allergies   Patient has no known allergies.   Review of Systems Review of Systems  Unable to perform ROS: Dementia     Physical Exam Updated Vital Signs BP (!) 164/59   Pulse 93   Temp 98.1 F (36.7 C) (Oral)   Resp 16   Wt 72.6 kg   SpO2 98%   BMI 25.82 kg/m   Physical Exam HENT:     Head: Atraumatic.     Mouth/Throat:     Mouth: Mucous membranes are moist.  Eyes:     General: No scleral icterus. Neck:     Musculoskeletal: Neck supple.  Cardiovascular:     Rate and Rhythm: Normal rate.  Pulmonary:     Breath sounds: No wheezing or rhonchi.  Abdominal:     Comments: Mild distention.  No hernia palpated.  Musculoskeletal:     Comments: Ecchymosis to left hand particularly over fourth and fifth MCP joint.  Ecchymosis to left knee.  Good range of motion in left hip.  No tenderness over ankle.  No lumbar tenderness.  Skin:     General: Skin is warm.  Neurological:     Mental Status: She is alert. Mental status is at baseline.      ED Treatments / Results  Labs (all labs ordered are listed, but only abnormal results are displayed) Labs Reviewed  COMPREHENSIVE METABOLIC PANEL - Abnormal; Notable for the following components:      Result Value   Glucose, Bld 198 (*)    All other components within normal limits  CBC - Abnormal; Notable for the following components:   WBC 14.0 (*)    Hemoglobin 11.8 (*)    All other components within normal limits  LIPASE, BLOOD  URINALYSIS, ROUTINE W REFLEX MICROSCOPIC    EKG EKG Interpretation  Date/Time:  Wednesday November 06 2018 07:33:07 EST Ventricular Rate:  88 PR Interval:    QRS Duration: 93 QT Interval:  381 QTC Calculation: 461 R Axis:   -4 Text Interpretation:  Sinus rhythm Confirmed by Davonna Belling [phone]) on 11/06/2018 9:07:29 AM   Radiology Ct Head Wo Contrast  Result Date: 11/06/2018 CLINICAL DATA:  Vomiting, found on floor. EXAM: CT HEAD WITHOUT CONTRAST TECHNIQUE: Contiguous axial images were obtained from the base of the skull through the vertex without intravenous contrast. COMPARISON:  06/24/2010 FINDINGS: Brain: Large right posterior parietal hemorrhage measures 5.4 x 4.6 cm. Surrounding vasogenic edema. Extension of hemorrhage into the ventricular system with large amount of right lateral ventricular blood. Blood also noted in the 3rd ventricle and 4th ventricle. No hydrocephalus currently. There is 11 mm of right to left midline shift. Old infarcts noted in the right temporal and parietal lobes. There is atrophy and chronic small vessel disease changes. Vascular: No hyperdense vessel or unexpected calcification. Skull: No acute calvarial abnormality. Sinuses/Orbits: Visualized paranasal sinuses and mastoids clear. Orbital soft tissues unremarkable. Other: None IMPRESSION: Large right parietal hemorrhage with intraventricular extension and 11 mm  right to left midline shift. Old right temporoparietal infarct. Critical Value/emergent results were called by telephone at the time of interpretation on 11/06/2018 at 9:23 am to Dr. Davonna Belling , who verbally acknowledged these results. Atrophy, chronic small vessel disease. Electronically Signed   By: Rolm Baptise M.D.   On: 11/06/2018 09:25   Dg Knee Complete 4 Views Left  Result Date: 11/06/2018 CLINICAL DATA:  Left knee swelling and bruising  secondary to a fall. EXAM: LEFT KNEE - COMPLETE 4+ VIEW COMPARISON:  01/15/2013 FINDINGS: There is no fracture or joint effusion. Moderate arthritis in the patellofemoral compartment, unchanged. There is soft tissue swelling anterior to the knee. Faint chondrocalcinosis. Minimal marginal osteophytes in the lateral compartment. IMPRESSION: 1. No acute bone abnormality. 2. Soft tissue swelling anteriorly. Electronically Signed   By: Lorriane Shire M.D.   On: 11/06/2018 09:23   Dg Abd Acute 2+v W 1v Chest  Result Date: 11/06/2018 CLINICAL DATA:  Abdominal discomfort secondary to a fall. EXAM: DG ABDOMEN ACUTE W/ 1V CHEST COMPARISON:  Chest x-ray dated 03/07/2018 and lumbar radiographs dated 03/09/2015 FINDINGS: There is no evidence of dilated bowel loops or free intraperitoneal air. No radiopaque calculi or other significant radiographic abnormality is seen. Heart size and mediastinal contours are within normal limits. Both lungs are clear. Chronic slight lobulation of the left hemidiaphragm contour. IMPRESSION: Negative abdominal radiographs.  No acute cardiopulmonary disease. Electronically Signed   By: Lorriane Shire M.D.   On: 11/06/2018 09:26   Dg Hand Complete Left  Result Date: 11/06/2018 CLINICAL DATA:  Left hand pain secondary to a fall. EXAM: LEFT HAND - COMPLETE 3+ VIEW COMPARISON:  None. FINDINGS: There is no fracture or dislocation. There is severe arthritis of the first [facility] joint. Joint space narrowing at the PIP joint of the little finger. Prominent  degenerative cyst in the scaphoid. IMPRESSION: No acute abnormality.  Degenerative changes as described. Electronically Signed   By: Lorriane Shire M.D.   On: 11/06/2018 09:22    Procedures Procedures (including critical care time)  Medications Ordered in ED Medications  sodium chloride flush (NS) 0.9 % injection 3 mL (3 mLs Intravenous Given 11/06/18 0807)  ondansetron (ZOFRAN) injection 4 mg (4 mg Intravenous Given 11/06/18 0803)     Initial Impression / Assessment and Plan / ED Course  I have reviewed the triage vital signs and the nursing notes.  Pertinent labs & imaging results that were available during my care of the patient were reviewed by me and considered in my medical decision making (see chart for details).        Patient presents with vomiting and feeling bad.  Questionable fall.  Intracranial hemorrhage on CT.  I think this is likely the cause of the vomiting.  Discussed with patient and her sister.  Patient would not want surgery.  Has living will.  Had had previous intracranial hemorrhage that surgery was done on but patient sister states it was only because the surgeon said she would get back to a better quality of living which she no longer has since that previous surgery.  Both patient and sister state patient would not want it at this time.  Discussed with neurosurgery.  No need to transfer to [facility] since would not want surgical intervention.  Discussed with patient and her sister.  Has had a worsening lifestyle and would not want severe intervention.  Think likely will need palliative consult.  Will admit to hospitalist.  CRITICAL CARE Performed by: Davonna Belling Total critical care time: 30 minutes Critical care time was exclusive of separately billable procedures and treating other patients. Critical care was necessary to treat or prevent imminent or life-threatening deterioration. Critical care was time spent personally by me on the following activities:  development of treatment plan with patient and/or surrogate as well as nursing, discussions with consultants, evaluation of patient's response to treatment, examination of patient, obtaining history from patient or surrogate, ordering  and performing treatments and interventions, ordering and review of laboratory studies, ordering and review of radiographic studies, pulse oximetry and re-evaluation of patient's condition.  Final Clinical Impressions(s) / ED Diagnoses   Final diagnoses:  Hemorrhagic stroke [facility])    ED Discharge Orders    None       Davonna Belling, MD 11/06/18 [phone]

## 2018-11-06 NOTE — Clinical Social Work Note (Signed)
Patient Information   Patient Name Champagne, Paletta ([id number]) Sex Female DOB [birth year] SSN [id number]  Room Bed  APA12 APA12  Patient Demographics   Address [address] [place] [address] Phone [phone] (Home)  Patient Ethnicity & Race   Ethnic Group Patient Race  Not Hispanic or Latino White or Caucasian  Emergency Contact(s)   Name Relation Home Work Mobile  Busker,John Spouse [phone]    Druscilla Brownie [phone]  [phone]  Documents on File    Status Date Received Description  Documents for the Patient  EMR Patient Summary Not Received    Driver's License Not Received    [place] E-Signature HIPAA Notice of Privacy Received 11/24/12   [facility] E-Signature HIPAA Notice of Privacy Spanish Received 11/24/12   [place] HIPAA NOTICE OF PRIVACY - Scanned Received 01/15/13   Insurance Card Received 01/15/13   Advance Directives/Living Will/HCPOA/POA Received 03/27/18 Full Code  Advanced Beneficiary Notice (ABN) Not Received    Insurance Card Not Received    [place] HIPAA NOTICE OF PRIVACY - Scanned Not Received    Insurance Card Received 03/12/13 MEDICARE/BCBS/GNA   Insurance Card Not Received    Insurance Card Received 03/12/14 GNA  E-Signature AOB Spanish Not Received    Other Photo ID Not Received    Release of Information Not Received    Insurance Card Received 05/03/18 Medicare/BCBS//ROSM  Patient Photo   Photo of Patient  Patient Photo   Photo of Patient  Documents for the Encounter  AOB (Assignment of Insurance Benefits) Not Received    E-signature AOB Signed 11/06/18   MEDICARE RIGHTS Not Received    E-signature Medicare Rights Signed 11/06/18   ED Patient Billing Extract   ED PB Billing Extract  Admission Information   Current Information   Attending Provider Admitting Provider Admission Type Admission Status  Roxan Hockey, MD  Emergency Admission (Confirmed)       Admission Date/Time Discharge Date  Hospital Service Auth/Cert Status  14/48/18 07:20 AM  Emergency Medicine [place] Unit Room/Bed   [place] DEPT APA12/APA12        Admission   Complaint  fall  Hospital Account   Name Acct ID Class Status Primary Coverage  Leland, Staszewski [id number] Observation Open MEDICARE - MEDICARE PART A AND B      Guarantor Account (for [place] [id number])   Name Relation to Pt Service Area Active? Acct Type  Army Melia Self [facility] Yes Personal/Family  Address Phone    [address] [place], [place] [address] [phone])        Coverage Information (for Hospital Account [id number])   1. [place] PART A AND B   F/O Payor/Plan Precert #  MEDICARE/MEDICARE PART A AND B   Subscriber Subscriber #  Kamy, [place] [id number]  Address Phone  [address] [place], [place] [address]   2. BLUE CROSS BLUE SHIELD/BCBS SUPPLEMENT   F/O Payor/Plan Precert #  BLUE CROSS BLUE SHIELD/BCBS SUPPLEMENT   Subscriber Subscriber #  Chong, January [id number]  Address Phone  [address] [place], [place] [address] [phone]       Care Everywhere ID:  [id number]

## 2018-11-06 NOTE — Clinical Social Work Note (Signed)
Per attending and palliative care NP, patient referred to [facility]. Hospice home has no beds and patient will have to be GIP once facility reviews paperwork and family signs admission paperwork.    Chelsea Pratt, Clydene Pugh, LCSW

## 2018-11-06 NOTE — ED Triage Notes (Addendum)
Pt brought over by [facility]. Staff member reported that pt began getting sick during the night and was found in floor during the night. Pt reports HA and nausea. Small amt of light brown vomitus after arrival. noted to have bruising to left hand and left knee

## 2018-11-06 NOTE — ED Notes (Signed)
Spoke with Beth, Hospice nurse, about pt's status. Notified Courage, MD of Beth's request to speak with him regarding paperwork. Sent Beth to 300 floor to speak with Courage, MD.

## 2018-11-06 NOTE — Clinical Social Work Note (Deleted)
Patient Information   Patient Name Henslee, Lottman ([id number]) Sex Female DOB 03/19/1930 SSN [id number]    Room Bed  APA12 APA12  Patient Demographics   Address [address] [place] [address] Phone [phone] (Home)  Patient Ethnicity & Race   Ethnic Group Patient Race  Not Hispanic or Latino White or Caucasian  Emergency Contact(s)   Name Relation Home Work Mobile  Appleton,John Spouse [phone]    Druscilla Brownie [phone]  [phone]  Documents on File    Status Date Received Description  Documents for the Patient  EMR Patient Summary Not Received    Driver's License Not Received    [place] E-Signature HIPAA Notice of Privacy Received 11/24/12   [facility] E-Signature HIPAA Notice of Privacy Spanish Received 11/24/12   [place] HIPAA NOTICE OF PRIVACY - Scanned Received 01/15/13   Insurance Card Received 01/15/13   Advance Directives/Living Will/HCPOA/POA Received 03/27/18 Full Code  Advanced Beneficiary Notice (ABN) Not Received    Insurance Card Not Received    [place] HIPAA NOTICE OF PRIVACY - Scanned Not Received    Insurance Card Received 03/12/13 MEDICARE/BCBS/GNA   Insurance Card Not Received    Insurance Card Received 03/12/14 GNA  E-Signature AOB Spanish Not Received    Other Photo ID Not Received    Release of Information Not Received    Insurance Card Received 05/03/18 Medicare/BCBS//ROSM  Patient Photo   Photo of Patient  Patient Photo   Photo of Patient  Documents for the Encounter  AOB (Assignment of Insurance Benefits) Not Received    E-signature AOB Signed 11/06/18   MEDICARE RIGHTS Not Received    E-signature Medicare Rights Signed 11/06/18   ED Patient Billing Extract   ED PB Billing Extract  Admission Information   Current Information   Attending Provider Admitting Provider Admission Type Admission Status  Roxan Hockey, MD  Emergency Admission (Confirmed)       Admission Date/Time Discharge Date  Hospital Service Auth/Cert Status  46/27/03 07:20 AM  Emergency Medicine [place] Unit Room/Bed   [place] DEPT APA12/APA12        Admission   Complaint  fall  Hospital Account   Name Acct ID Class Status Primary Coverage  [place], Colaizzi [id number] Observation Open MEDICARE - MEDICARE PART A AND B      Guarantor Account (for [place] [id number])   Name Relation to Pt Service Area Active? Acct Type  Army Melia Self [facility] Yes Personal/Family  Address Phone    [address] [place], [place] [address] [phone])        Coverage Information (for Hospital Account [id number])   1. [place] PART A AND B   F/O Payor/Plan Precert #  MEDICARE/MEDICARE PART A AND B   Subscriber Subscriber #  Clar[place]e, Rigdon [id number]  Address Phone  [address] [place], [place] [address]   2. BLUE CROSS BLUE SHIELD/BCBS SUPPLEMENT   F/O Payor/Plan Precert #  BLUE CROSS BLUE SHIELD/BCBS SUPPLEMENT   Subscriber Subscriber #  Arisbeth, [place] [id number]  Address Phone  [address] [place], [place] [address] [phone]       Care Everywhere ID:  [id number]

## 2018-11-06 NOTE — Clinical Social Work Note (Deleted)
Patient Information   Patient Name Chelsea Pratt, Chelsea Pratt ([id number]) Sex Female DOB 1930-04-11 SSN [id number]  Room Bed  APA12 APA12  Patient Demographics   Address [address] [place] [address] Phone [phone] (Home)  Patient Ethnicity & Race   Ethnic Group Patient Race  Not Hispanic or Latino White or Caucasian  Emergency Contact(s)   Name Relation Home Work Mobile  Sheley,John Spouse [phone]    Druscilla Brownie [phone]  [phone]  Documents on File    Status Date Received Description  Documents for the Patient  EMR Patient Summary Not Received    Driver's License Not Received    [place] E-Signature HIPAA Notice of Privacy Received 11/24/12   [facility] E-Signature HIPAA Notice of Privacy Spanish Received 11/24/12   [place] HIPAA NOTICE OF PRIVACY - Scanned Received 01/15/13   Insurance Card Received 01/15/13   Advance Directives/Living Will/HCPOA/POA Received 03/27/18 Full Code  Advanced Beneficiary Notice (ABN) Not Received    Insurance Card Not Received    [place] HIPAA NOTICE OF PRIVACY - Scanned Not Received    Insurance Card Received 03/12/13 MEDICARE/BCBS/GNA   Insurance Card Not Received    Insurance Card Received 03/12/14 GNA  E-Signature AOB Spanish Not Received    Other Photo ID Not Received    Release of Information Not Received    Insurance Card Received 05/03/18 Medicare/BCBS//ROSM  Patient Photo   Photo of Patient  Patient Photo   Photo of Patient  Documents for the Encounter  AOB (Assignment of Insurance Benefits) Not Received    E-signature AOB Signed 11/06/18   MEDICARE RIGHTS Not Received    E-signature Medicare Rights Signed 11/06/18   ED Patient Billing Extract   ED PB Billing Extract  Admission Information   Current Information   Attending Provider Admitting Provider Admission Type Admission Status  Roxan Hockey, MD  Emergency Admission (Confirmed)       Admission Date/Time Discharge Date  Hospital Service Auth/Cert Status  40/37/09 07:20 AM  Emergency Medicine [place] Unit Room/Bed   [place] DEPT APA12/APA12        Admission   Complaint  fall  Hospital Account   Name Acct ID Class Status Primary Coverage  Chelsea Pratt, Chelsea Pratt [id number] Observation Open MEDICARE - MEDICARE PART A AND B      Guarantor Account (for [place] [id number])   Name Relation to Pt Service Area Active? Acct Type  Chelsea Pratt Self [facility] Yes Personal/Family  Address Phone    [address] [place], [place] [address] [phone])        Coverage Information (for Hospital Account [id number])   1. [place] PART A AND B   F/O Payor/Plan Precert #  MEDICARE/MEDICARE PART A AND B   Subscriber Subscriber #  Chelsea Pratt, Chelsea Pratt [id number]  Address Phone  [address] [place], [place] [address]   2. BLUE CROSS BLUE SHIELD/BCBS SUPPLEMENT   F/O Payor/Plan Precert #  BLUE CROSS BLUE SHIELD/BCBS SUPPLEMENT   Subscriber Subscriber #  Chelsea Pratt, Chelsea Pratt [id number]  Address Phone  [address] [place], [place] [address] [phone]       Care Everywhere ID:  [id number]

## 2018-11-06 NOTE — Consult Note (Signed)
Consultation Note Date: 11/06/2018   Patient Name: Chelsea Pratt  DOB: 02-Sep-1930  MRN: [id number]  Age / Sex: 85 y.o., female  PCP: Celene Squibb, MD Referring Physician: Roxan Hockey, MD  Reason for Consultation: Establishing goals of care and Inpatient hospice referral  HPI/Patient Profile: 85 y.o. female  with past medical history of HTN, hostory of stroke, R hip repair admitted on 11/06/2018 with R ICH.   Clinical Assessment and Goals of Care: Chelsea Pratt is lying quietly on the stretcher.  She appears quite weak and frail.    Meeting with Dr. Joesph Fillers, sister, [name] and brother, Keitha Butte to discuss end of life care.  We talk about residential hospice care and what this would look like. Chelsea Pratt husband is 51 years of age and has at home hospice care with [facility].   We talk about care in the residential hospice and care in the hospital. We discuss GIP and transfer to hospice if stable when bed available.  All questions answered, Family reassured.   HCPOA    NEXT OF KIN  - sister, [name] and brotherKeitha Butte [phone]    [place] is requesting comfort and dignity at end of life, let nature take it's course.  Residential Hospice with [facility] (choice offered)  Code Status/Advance Care Planning:  DNR  Symptom Management:   Per hospitalist no additional needs at this time.   Palliative Prophylaxis:   Turn Reposition  Additional Recommendations (Limitations, Scope, Preferences):  Full Comfort Care  Psycho-social/Spiritual:   Desire for further Chaplaincy support:no  Additional Recommendations: Caregiving  Support/Resources and Education on Hospice  Prognosis:   < 2 weeks  Discharge Planning: Comfort and dignity at end of life, residential hospice at [facility]      Primary Diagnoses: Present on Admission: .  ICH (intracerebral hemorrhage) ([place])   I have reviewed the medical record, interviewed the patient and family, and examined the patient. The following aspects are pertinent.  Past Medical History:  Diagnosis Date  . HTN (hypertension)   . Stroke [facility])    Social History   Socioeconomic History  . Marital status: Married    Spouse name: Jenny Reichmann   . Number of children: 0  . Years of education: 20  . Highest education level: Not on file  Occupational History  . Occupation: Medical sales representative  . Occupation: Retired   Scientific laboratory technician  . Financial resource strain: Not on file  . Food insecurity:    Worry: Not on file    Inability: Not on file  . Transportation needs:    Medical: Not on file    Non-medical: Not on file  Tobacco Use  . Smoking status: Never Smoker  . Smokeless tobacco: Never Used  Substance and Sexual Activity  . Alcohol use: No  . Drug use: No  . Sexual activity: Not on file  Lifestyle  . Physical activity:    Days per week: Not on file    Minutes per session:  Not on file  . Stress: Not on file  Relationships  . Social connections:    Talks on phone: Not on file    Gets together: Not on file    Attends religious service: Not on file    Active member of club or organization: Not on file    Attends meetings of clubs or organizations: Not on file    Relationship status: Not on file  Other Topics Concern  . Not on file  Social History Narrative   Patient lives at home with husband Jenny Reichmann.    Patient has no children.    Patient is retired.    Patient has a high school education.    Patient is right handed.    Family History  Problem Relation Age of Onset  . Heart attack Father    Scheduled Meds: . sodium chloride flush  3 mL Intravenous Q12H   Continuous Infusions: . sodium chloride     PRN Meds:.sodium chloride, acetaminophen **OR** acetaminophen, antiseptic oral rinse, bisacodyl, loperamide, LORazepam **OR** LORazepam **OR** LORazepam, LORazepam, morphine  injection, ondansetron **OR** ondansetron (ZOFRAN) IV, polyvinyl alcohol, sodium chloride flush Medications Prior to Admission:  Prior to Admission medications   Medication Sig Start Date End Date Taking? Authorizing Provider  acetaminophen (TYLENOL) 500 MG tablet Take 500 mg by mouth every 6 (six) hours as needed.    [provider]  aspirin EC 81 MG tablet Take 81 mg by mouth daily. Starting 04/05/2018    [provider]  cyclobenzaprine (FLEXERIL) 5 MG tablet Take 1 tablet (5 mg total) by mouth 3 (three) times daily as needed for muscle spasms. 03/09/15   Orpah Greek, MD  diclofenac sodium (VOLTAREN) 1 % GEL Apply 4 g topically every 6 (six) hours. Apply to right hip for pain not to exceed 32 grams daily to all joints    [provider]  docusate sodium (COLACE) 100 MG capsule Take 1 capsule (100 mg total) by mouth 2 (two) times daily. 03/07/18   Manuella Ghazi, Pratik D, DO  losartan (COZAAR) 25 MG tablet Take 25 mg by mouth daily as needed (when blood pressure is over 140).     [provider]  senna-docusate (SENOKOT-S) 8.6-50 MG tablet Take 1 tablet by mouth at bedtime as needed for mild constipation. 03/07/18   Manuella Ghazi, Pratik D, DO   No Known Allergies Review of Systems  Unable to perform ROS: Patient unresponsive    Physical Exam Vitals signs and nursing note reviewed.  Constitutional:      Comments: Appears acutely ill, frail  HENT:     Head:     Comments: Facial bruising Cardiovascular:     Rate and Rhythm: Normal rate.  Pulmonary:     Effort: Pulmonary effort is normal. No respiratory distress.  Abdominal:     Palpations: Abdomen is soft.  Skin:    General: Skin is warm and dry.  Neurological:     Comments: Does not respond at this time     Vital Signs: BP (!) 173/78   Pulse 86   Temp 98.1 F (36.7 C) (Oral)   Resp 16   Wt 72.6 kg   SpO2 98%   BMI 25.82 kg/m  Pain Scale: 0-10   Pain Score: 5    SpO2: SpO2: 98 % O2 Device:SpO2:  98 % O2 Flow Rate: .   IO: Intake/output summary: No intake or output data in the 24 hours ending 11/06/18 1139  LBM:   Baseline Weight: Weight:  72.6 kg Most recent weight: Weight: 72.6 kg     Palliative Assessment/Data:   Flowsheet Rows     Most Recent Value  Intake Tab  Referral Department  Hospitalist  Unit at Time of Referral  ER  Palliative Care Primary Diagnosis  Neurology  Date Notified  11/06/18  Palliative Care Type  New Palliative care  Reason for referral  Clarify Goals of Care, End of Life Care Assistance  Date of Admission  11/06/18  Date first seen by Palliative Care  11/06/18  # of days Palliative referral response time  0 Day(s)  # of days IP prior to Palliative referral  0  Clinical Assessment  Palliative Performance Scale Score  20%  Pain Max last 24 hours  Not able to report  Pain Min Last 24 hours  Not able to report  Dyspnea Max Last 24 Hours  Not able to report  Dyspnea Min Last 24 hours  Not able to report  Psychosocial & Spiritual Assessment  Social Work Plan of Care  Education on Hospice  Palliative Care Outcomes  Patient/Family meeting held?  Yes  Who was at the meeting?  sister and brother  Palliative Care Outcomes  Improved non-pain symptom therapy  Patient/Family wishes: Interventions discontinued/not started   Mechanical Ventilation      Time In: 1100 Time Out: 1135 Time Total: 35 minutes Greater than 50%  of this time was spent counseling and coordinating care related to the above assessment and plan.  Signed by: Drue Novel, NP   Please contact Palliative Medicine Team phone at [phone] for questions and concerns.  For individual provider: See Shea Evans

## 2018-11-06 NOTE — Clinical Social Work Note (Signed)
Patient will go to [facility] home if she is medically stable for transport on 11/07/2018 as a bed is now available.

## 2018-11-06 NOTE — ED Notes (Signed)
Pt complaining of headache and nausea. Pt actively vomiting.

## 2018-11-06 NOTE — ED Notes (Signed)
Hospitalist called me and notified me that he has made a bed request but it working on getting pt moved to a hospice house for palliative terminal care. CSW is here to work on this.

## 2018-11-06 NOTE — Progress Notes (Signed)
Spoke with Cassandra at hospice. Stated they would take patient tomorrow, if medically stable on 11/07/18. Social work's help would be appreciated for arranging EMS transport. Hospice said earlier would be better. To schedule transport for about 9am-10am on 11/07/18.

## 2018-11-06 NOTE — ED Notes (Signed)
Spoke with [facility] regarding pt status. Notified them of pt disposition to inpatient treatment at this time.

## 2018-11-07 DIAGNOSIS — R402213 Coma scale, best verbal response, none, at hospital admission: Secondary | ICD-10-CM | POA: Diagnosis present

## 2018-11-07 DIAGNOSIS — W19XXXA Unspecified fall, initial encounter: Secondary | ICD-10-CM | POA: Diagnosis not present

## 2018-11-07 DIAGNOSIS — Z96641 Presence of right artificial hip joint: Secondary | ICD-10-CM | POA: Diagnosis present

## 2018-11-07 DIAGNOSIS — M1990 Unspecified osteoarthritis, unspecified site: Secondary | ICD-10-CM | POA: Diagnosis not present

## 2018-11-07 DIAGNOSIS — Z7401 Bed confinement status: Secondary | ICD-10-CM | POA: Diagnosis not present

## 2018-11-07 DIAGNOSIS — R1110 Vomiting, unspecified: Secondary | ICD-10-CM | POA: Diagnosis not present

## 2018-11-07 DIAGNOSIS — I615 Nontraumatic intracerebral hemorrhage, intraventricular: Secondary | ICD-10-CM | POA: Diagnosis present

## 2018-11-07 DIAGNOSIS — I10 Essential (primary) hypertension: Secondary | ICD-10-CM | POA: Diagnosis not present

## 2018-11-07 DIAGNOSIS — Z7982 Long term (current) use of aspirin: Secondary | ICD-10-CM | POA: Diagnosis not present

## 2018-11-07 DIAGNOSIS — R402343 Coma scale, best motor response, flexion withdrawal, at hospital admission: Secondary | ICD-10-CM | POA: Diagnosis present

## 2018-11-07 DIAGNOSIS — R531 Weakness: Secondary | ICD-10-CM | POA: Diagnosis not present

## 2018-11-07 DIAGNOSIS — F0390 Unspecified dementia without behavioral disturbance: Secondary | ICD-10-CM | POA: Diagnosis present

## 2018-11-07 DIAGNOSIS — Z66 Do not resuscitate: Secondary | ICD-10-CM | POA: Diagnosis present

## 2018-11-07 DIAGNOSIS — R404 Transient alteration of awareness: Secondary | ICD-10-CM | POA: Diagnosis not present

## 2018-11-07 DIAGNOSIS — I619 Nontraumatic intracerebral hemorrhage, unspecified: Secondary | ICD-10-CM | POA: Diagnosis not present

## 2018-11-07 DIAGNOSIS — Z8249 Family history of ischemic heart disease and other diseases of the circulatory system: Secondary | ICD-10-CM | POA: Diagnosis not present

## 2018-11-07 DIAGNOSIS — Z8673 Personal history of transient ischemic attack (TIA), and cerebral infarction without residual deficits: Secondary | ICD-10-CM | POA: Diagnosis not present

## 2018-11-07 DIAGNOSIS — I611 Nontraumatic intracerebral hemorrhage in hemisphere, cortical: Secondary | ICD-10-CM | POA: Diagnosis present

## 2018-11-07 DIAGNOSIS — G9349 Other encephalopathy: Secondary | ICD-10-CM | POA: Diagnosis present

## 2018-11-07 DIAGNOSIS — Z515 Encounter for palliative care: Secondary | ICD-10-CM | POA: Diagnosis present

## 2018-11-07 DIAGNOSIS — R402123 Coma scale, eyes open, to pain, at hospital admission: Secondary | ICD-10-CM | POA: Diagnosis present

## 2018-11-07 DIAGNOSIS — R58 Hemorrhage, not elsewhere classified: Secondary | ICD-10-CM | POA: Diagnosis not present

## 2018-11-07 DIAGNOSIS — I959 Hypotension, unspecified: Secondary | ICD-10-CM | POA: Diagnosis not present

## 2018-11-07 DIAGNOSIS — S06340A Traumatic hemorrhage of right cerebrum without loss of consciousness, initial encounter: Secondary | ICD-10-CM | POA: Diagnosis not present

## 2018-11-07 MED ORDER — BIOTENE DRY MOUTH MT LIQD: 15.0000 mL | OROMUCOSAL | 0 refills | Status: AC | PRN

## 2018-11-07 MED ORDER — ACETAMINOPHEN 325 MG PO TABS: 650.0000 mg | ORAL_TABLET | Freq: Four times a day (QID) | ORAL | 0 refills | Status: AC | PRN

## 2018-11-07 MED ORDER — POLYVINYL ALCOHOL 1.4 % OP SOLN: 1.0000 [drp] | Freq: Four times a day (QID) | OPHTHALMIC | 0 refills | Status: AC | PRN

## 2018-11-07 NOTE — Progress Notes (Signed)
Patient to be discharged to [facility]. [facility] to transport patient. Discharge instructions in packet for hospice. One peripheral IV removed prior to transport.

## 2018-11-07 NOTE — Discharge Summary (Signed)
Chelsea Pratt, is a 85 y.o. female  DOB [birth year]  MRN [id number].  Admission date:  11/06/2018  Admitting Physician  Jakira Mcfadden Denton Brick, MD  Discharge Date:  11/07/2018   Primary MD  Celene Squibb, MD  Recommendations for primary care physician for things to follow:   Admission Diagnosis  Hemorrhagic stroke [facility]) [I61.9]   Discharge Diagnosis  Hemorrhagic stroke [facility]) [I61.9]   Active Problems:   ICH (intracerebral hemorrhage) [facility])      Past Medical History:  Diagnosis Date  . HTN (hypertension)   . Stroke [facility])     Past Surgical History:  Procedure Laterality Date  . BRAIN SURGERY    . HIP ARTHROPLASTY Right 03/05/2018   Procedure: ARTHROPLASTY BIPOLAR HIP (HEMIARTHROPLASTY);  Surgeon: Carole Civil, MD;  Location: [facility];  Service: Orthopedics;  Laterality: Right;      HPI  from the history and physical done on the day of admission:    Chelsea Pratt  is a 85 y.o. female with prior history of ICH back in April 08, 2010 with subsequent brain surgery, history of hypertension and dementia... Presents from [place] assisted living facility with headaches, vomiting and possible fall with bruising to her left knee and left hand--- she was found on the floor  Patient is a poor historian  Additional history obtained from patient's sister and brother at bedside  In ED--CT head with ICH--CT head with Large right parietal hemorrhage with intraventricular extension and 11 mm right to left midline shift.  Face to Face Conference with Pt's sister and pt's Brother as well as Ms Hulan Fray (Palliative) ... [name] 66 year old husband is currently on home hospice care, patient has a living will, patient previously had evacuation of hematoma after large ICH on April 18, 2010... At this time patient's husband, patient's brother, patient's sister and patient does not want any neurosurgical intervention---  requesting palliative/hospice care..   EDP discussed with neurosurgeon at [facility] who advised against transfer as patient and family does not want neurosurgical intervention  One episode of loose stools in the ED    Hospital Course:   1)Rt Sided ICH--- CT head with Large right parietal hemorrhage with intraventricular extension and 11 mm right to left midline shift.  Aspirin has been discontinued Face to Face Conference with Pt's sister and pt's Brother as well as Ms Hulan Fray (Palliative) ... [name] 17 year old husband is currently on home hospice care, patient has a living will, patient previously had evacuation of hematoma after large ICH on April 18, 2010... At this time patient's husband, patient's brother, patient's sister and patient does not want any neurosurgical intervention--- they are requesting palliative/hospice care.. Patient is full comfort measures.  Residential hospice overall care at home apparently does not have a bed at this time, patient will be brought into the hospital until a bed opens up at residential hospice facility--- comfort care/hospice orders initiated.  Prognosis is grave, expected life expectancy less than 2 weeks.    2)Social/Ethics--- DNR/DNI, patient sister (Ms [name] [id number]---[phone]) is  the primary decision maker.. Prognosis is grave, expected life expectancy less than 2 weeks.  Anticipate transfer to residential hospice when bed opens up, IV morphine sulfate and lorazepam as ordered, palliative care and social work consults/input appreciated  3)HTN--given ICH use IV hydralazine as needed elevated BP  4)FEN--- comfort feeding as desired  Discharge Condition:  Poor/Grave Prognosis  Follow UP  Contact information for after-discharge care    [place] .   Service:  Inpatient Hospice Contact information: [address] [place] [phone]               Diet and  Activity recommendation:  As advised  Discharge Instructions    Discharge Instructions    Bed rest   Complete by:  As directed    Call MD for:  temperature >100.4   Complete by:  As directed    Diet general   Complete by:  As directed    Discharge instructions   Complete by:  As directed    Transfer to Residential Hospice      Discharge Medications     Allergies as of 11/07/2018   No Known Allergies     Medication List    STOP taking these medications   ALPRAZolam 0.25 MG tablet Commonly known as:  XANAX   aspirin EC 81 MG tablet   cyclobenzaprine 5 MG tablet Commonly known as:  FLEXERIL   docusate sodium 100 MG capsule Commonly known as:  COLACE   LASIX 20 MG tablet Generic drug:  furosemide   losartan 25 MG tablet Commonly known as:  COZAAR   Melatonin 10 MG Tabs   potassium chloride 10 MEQ tablet Commonly known as:  K-DUR   senna-docusate 8.6-50 MG tablet Commonly known as:  Senokot-S   sertraline 50 MG tablet Commonly known as:  ZOLOFT   VOLTAREN 1 % Gel Generic drug:  diclofenac sodium     TAKE these medications   acetaminophen 325 MG tablet Commonly known as:  TYLENOL Take 2 tablets (650 mg total) by mouth every 6 (six) hours as needed for mild pain (or Fever >/= 101). What changed:    medication strength  how much to take  reasons to take this   antiseptic oral rinse Liqd Apply 15 mLs topically as needed for dry mouth.   polyvinyl alcohol 1.4 % ophthalmic solution Commonly known as:  LIQUIFILM TEARS Place 1 drop into both eyes 4 (four) times daily as needed for dry eyes.       Major procedures and Radiology Reports - PLEASE review detailed and final reports for all details, in brief -   Ct Head Wo Contrast  Result Date: 11/06/2018 CLINICAL DATA:  Vomiting, found on floor. EXAM: CT HEAD WITHOUT CONTRAST TECHNIQUE: Contiguous axial images were obtained from the base of the skull through the vertex without intravenous contrast.  COMPARISON:  06/24/2010 FINDINGS: Brain: Large right posterior parietal hemorrhage measures 5.4 x 4.6 cm. Surrounding vasogenic edema. Extension of hemorrhage into the ventricular system with large amount of right lateral ventricular blood. Blood also noted in the 3rd ventricle and 4th ventricle. No hydrocephalus currently. There is 11 mm of right to left midline shift. Old infarcts noted in the right temporal and parietal lobes. There is atrophy and chronic small vessel disease changes. Vascular: No hyperdense vessel or unexpected calcification. Skull: No acute calvarial abnormality. Sinuses/Orbits: Visualized paranasal sinuses and mastoids clear. Orbital soft tissues unremarkable. Other: None IMPRESSION: Large  right parietal hemorrhage with intraventricular extension and 11 mm right to left midline shift. Old right temporoparietal infarct. Critical Value/emergent results were called by telephone at the time of interpretation on 11/06/2018 at 9:23 am to Dr. Davonna Belling , who verbally acknowledged these results. Atrophy, chronic small vessel disease. Electronically Signed   By: Rolm Baptise M.D.   On: 11/06/2018 09:25   Dg Knee Complete 4 Views Left  Result Date: 11/06/2018 CLINICAL DATA:  Left knee swelling and bruising secondary to a fall. EXAM: LEFT KNEE - COMPLETE 4+ VIEW COMPARISON:  01/15/2013 FINDINGS: There is no fracture or joint effusion. Moderate arthritis in the patellofemoral compartment, unchanged. There is soft tissue swelling anterior to the knee. Faint chondrocalcinosis. Minimal marginal osteophytes in the lateral compartment. IMPRESSION: 1. No acute bone abnormality. 2. Soft tissue swelling anteriorly. Electronically Signed   By: Lorriane Shire M.D.   On: 11/06/2018 09:23   Dg Abd Acute 2+v W 1v Chest  Result Date: 11/06/2018 CLINICAL DATA:  Abdominal discomfort secondary to a fall. EXAM: DG ABDOMEN ACUTE W/ 1V CHEST COMPARISON:  Chest x-ray dated 03/07/2018 and lumbar radiographs dated  03/09/2015 FINDINGS: There is no evidence of dilated bowel loops or free intraperitoneal air. No radiopaque calculi or other significant radiographic abnormality is seen. Heart size and mediastinal contours are within normal limits. Both lungs are clear. Chronic slight lobulation of the left hemidiaphragm contour. IMPRESSION: Negative abdominal radiographs.  No acute cardiopulmonary disease. Electronically Signed   By: Lorriane Shire M.D.   On: 11/06/2018 09:26   Dg Hand Complete Left  Result Date: 11/06/2018 CLINICAL DATA:  Left hand pain secondary to a fall. EXAM: LEFT HAND - COMPLETE 3+ VIEW COMPARISON:  None. FINDINGS: There is no fracture or dislocation. There is severe arthritis of the first [facility] joint. Joint space narrowing at the PIP joint of the little finger. Prominent degenerative cyst in the scaphoid. IMPRESSION: No acute abnormality.  Degenerative changes as described. Electronically Signed   By: Lorriane Shire M.D.   On: 11/06/2018 09:22    Micro Results    Recent Results (from the past 240 hour(s))  MRSA PCR Screening     Status: None   Collection Time: 11/06/18  5:08 PM  Result Value Ref Range Status   MRSA by PCR NEGATIVE NEGATIVE Final    Comment:        The GeneXpert MRSA Assay (FDA approved for NASAL specimens only), is one component of a comprehensive MRSA colonization surveillance program. It is not intended to diagnose MRSA infection nor to guide or monitor treatment for MRSA infections. Performed at [facility], [address], [place], [place] [address]     Today   Subjective    Kihanna Kamiya today has no new concerns          Patient has been seen and examined prior to discharge   Objective   Blood pressure (!) 149/47, pulse 78, temperature 99.4 F (37.4 C), temperature source Rectal, resp. rate (!) 22, height 5\' 2"  (1.575 m), weight 67.9 kg, SpO2 96 %.   Intake/Output Summary (Last 24 hours) at 11/07/2018 0915 Last data filed at 11/07/2018 0850 Gross  per 24 hour  Intake 708.09 ml  Output 1800 ml  Net -1091.91 ml    Exam Physical Examination: General appearance -awake, in no acute distress Mental status -underlying dementia with cognitive deficits now altered mentation/encephalopathy due to [place] Eyes - sclera anicteric Neck - supple, no JVD elevation , Chest - clear  to auscultation bilaterally, symmetrical air movement,  Heart - S1 and S2 normal, regular  Abdomen - soft, nontender, nondistended, no masses or organomegaly Neurological -Limited exam due to underlying dementia and superimposed encephalopathy secondary to Roff , Lt Sided weakness noted extremities - no pedal edema noted, intact peripheral pulses, left arm and left knee ecchymosis presumably from fall Skin - warm, dry   Data Review   CBC w Diff:  Lab Results  Component Value Date   WBC 14.0 (H) 11/06/2018   HGB 11.8 (L) 11/06/2018   HCT 37.9 11/06/2018   PLT 242 11/06/2018   LYMPHOPCT 27 04/30/2018   MONOPCT 11 04/30/2018   EOSPCT 3 04/30/2018   BASOPCT 1 04/30/2018    CMP:  Lab Results  Component Value Date   NA 135 11/06/2018   K 3.6 11/06/2018   CL 99 11/06/2018   CO2 25 11/06/2018   BUN 16 11/06/2018   CREATININE 0.70 11/06/2018   PROT 6.8 11/06/2018   ALBUMIN 3.9 11/06/2018   BILITOT 0.5 11/06/2018   ALKPHOS 97 11/06/2018   AST 24 11/06/2018   ALT 13 11/06/2018   Total Discharge time is about 33 minutes  Roxan Hockey M.D on 11/07/2018 at 9:15 AM  Go to [email or web address] -  for contact info  [facility] - Office  [phone]

## 2018-11-08 DIAGNOSIS — R531 Weakness: Secondary | ICD-10-CM | POA: Diagnosis not present

## 2018-11-08 DIAGNOSIS — M1990 Unspecified osteoarthritis, unspecified site: Secondary | ICD-10-CM | POA: Diagnosis not present

## 2018-11-08 DIAGNOSIS — R1110 Vomiting, unspecified: Secondary | ICD-10-CM | POA: Diagnosis not present

## 2018-11-08 DIAGNOSIS — I619 Nontraumatic intracerebral hemorrhage, unspecified: Secondary | ICD-10-CM | POA: Diagnosis not present

## 2018-11-08 DIAGNOSIS — I10 Essential (primary) hypertension: Secondary | ICD-10-CM | POA: Diagnosis not present

## 2018-11-09 DIAGNOSIS — R1110 Vomiting, unspecified: Secondary | ICD-10-CM | POA: Diagnosis not present

## 2018-11-09 DIAGNOSIS — I619 Nontraumatic intracerebral hemorrhage, unspecified: Secondary | ICD-10-CM | POA: Diagnosis not present

## 2018-11-09 DIAGNOSIS — I10 Essential (primary) hypertension: Secondary | ICD-10-CM | POA: Diagnosis not present

## 2018-11-09 DIAGNOSIS — R531 Weakness: Secondary | ICD-10-CM | POA: Diagnosis not present

## 2018-11-09 DIAGNOSIS — M1990 Unspecified osteoarthritis, unspecified site: Secondary | ICD-10-CM | POA: Diagnosis not present

## 2018-11-10 DIAGNOSIS — I619 Nontraumatic intracerebral hemorrhage, unspecified: Secondary | ICD-10-CM | POA: Diagnosis not present

## 2018-11-10 DIAGNOSIS — R1110 Vomiting, unspecified: Secondary | ICD-10-CM | POA: Diagnosis not present

## 2018-11-10 DIAGNOSIS — R531 Weakness: Secondary | ICD-10-CM | POA: Diagnosis not present

## 2018-11-10 DIAGNOSIS — R404 Transient alteration of awareness: Secondary | ICD-10-CM | POA: Diagnosis not present

## 2018-11-10 DIAGNOSIS — M1990 Unspecified osteoarthritis, unspecified site: Secondary | ICD-10-CM | POA: Diagnosis not present

## 2018-11-10 DIAGNOSIS — I10 Essential (primary) hypertension: Secondary | ICD-10-CM | POA: Diagnosis not present

## 2018-12-04 DIAGNOSIS — 419620001 Death: Secondary | SNOMED CT | POA: Diagnosis not present

## 2018-12-04 DEATH — deceased

## 2019-04-03 ENCOUNTER — Other Ambulatory Visit: Payer: Self-pay

## 2019-07-09 IMAGING — DX DG HIP (WITH OR WITHOUT PELVIS) 2-3V*R*
3 series · 3 of 3 positions shown · non-contrast
Comparison: None.

CLINICAL DATA: Recent trip and fall with right hip pain, initial
encounter

EXAM:
DG HIP (WITH OR WITHOUT PELVIS) 3V RIGHT

[pelvis ap]
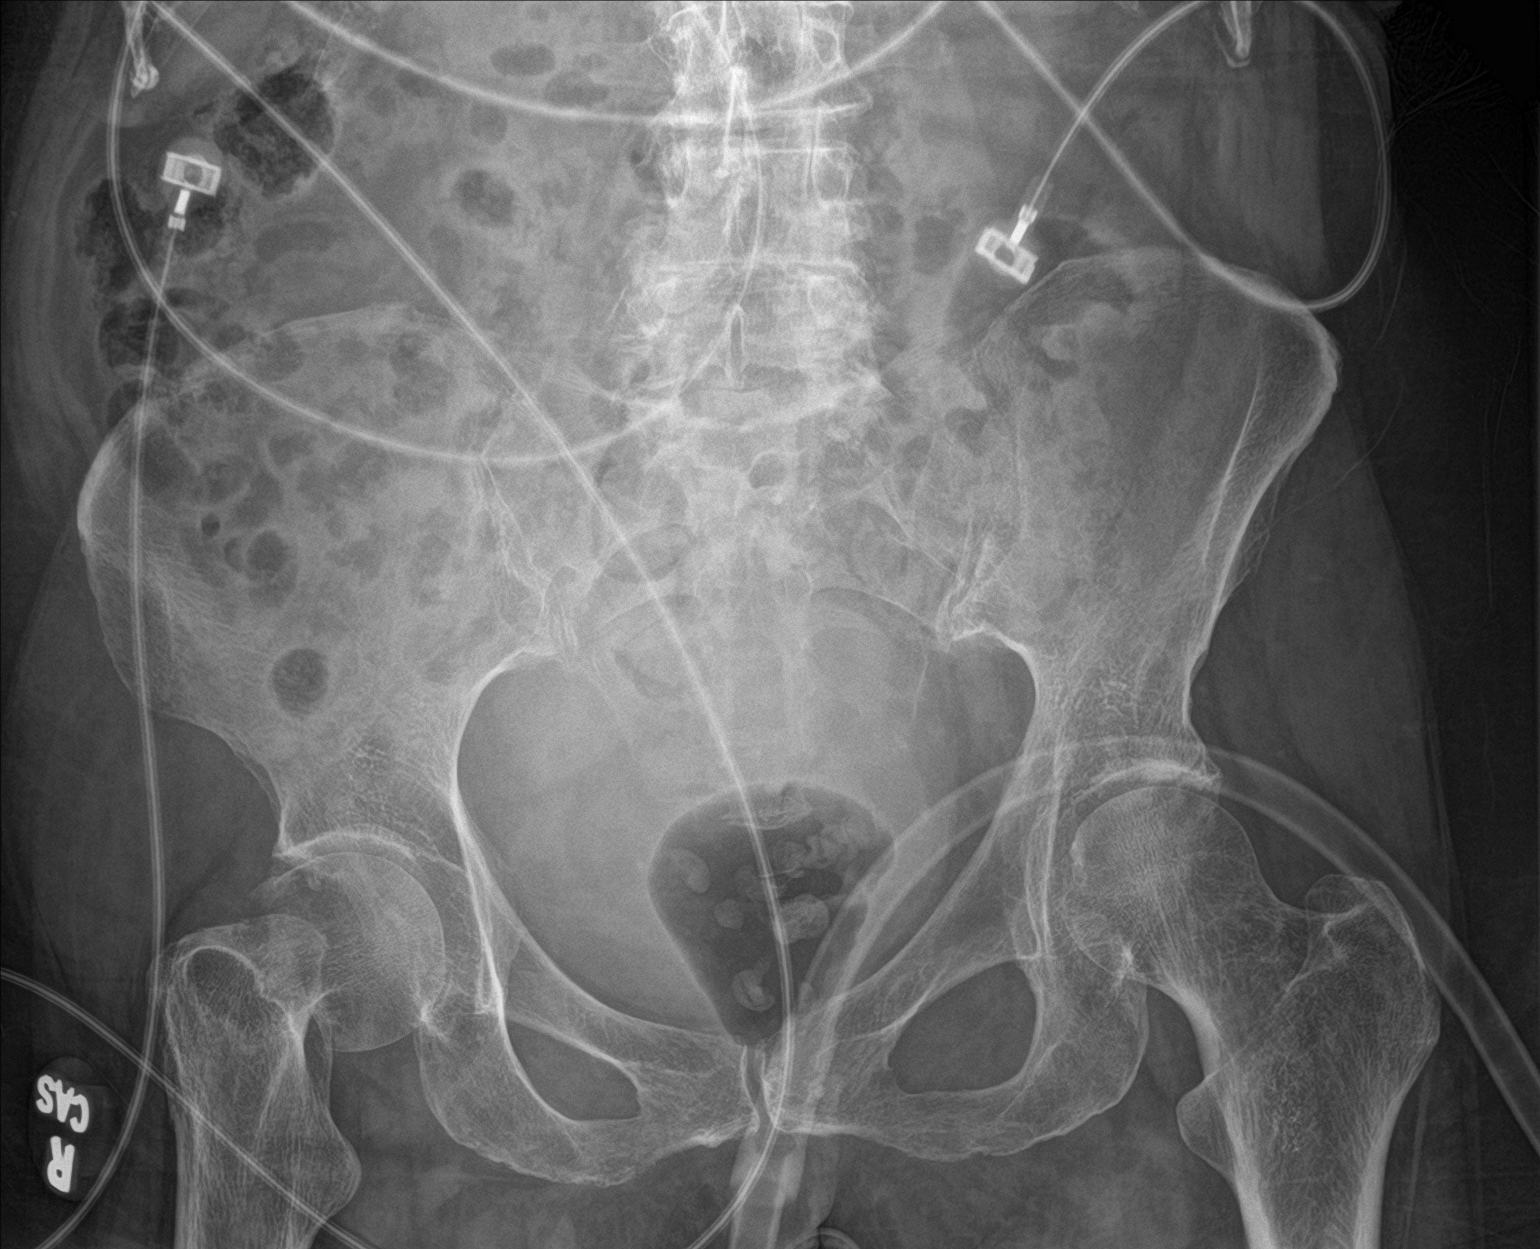

[hip ap]
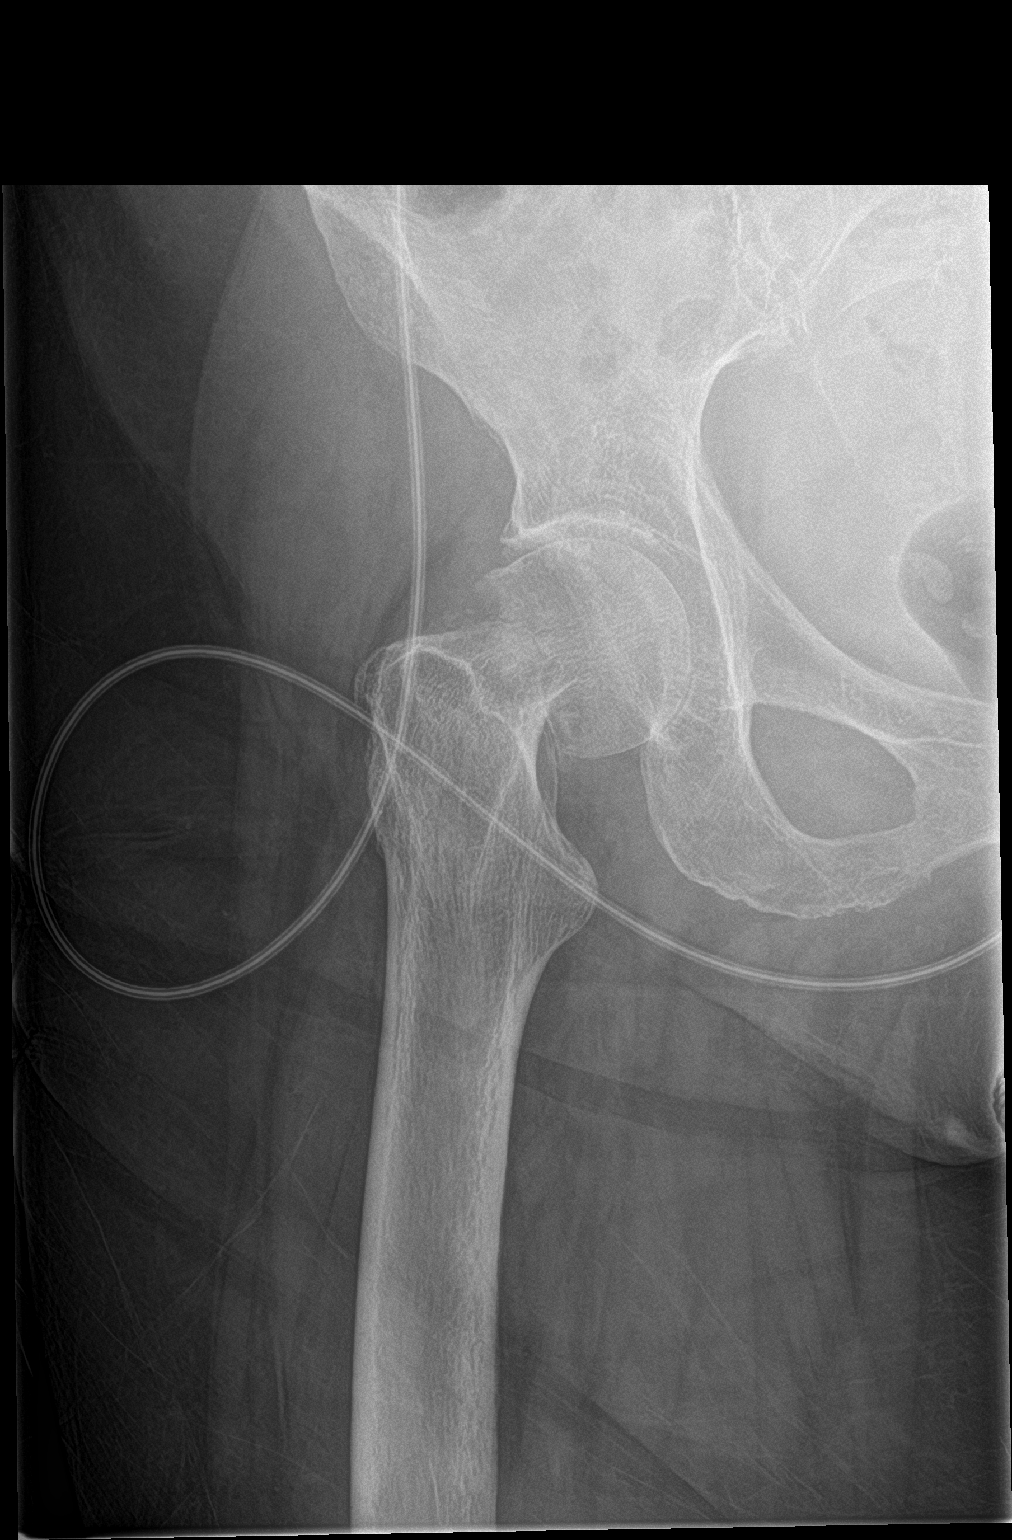

[hip lat]
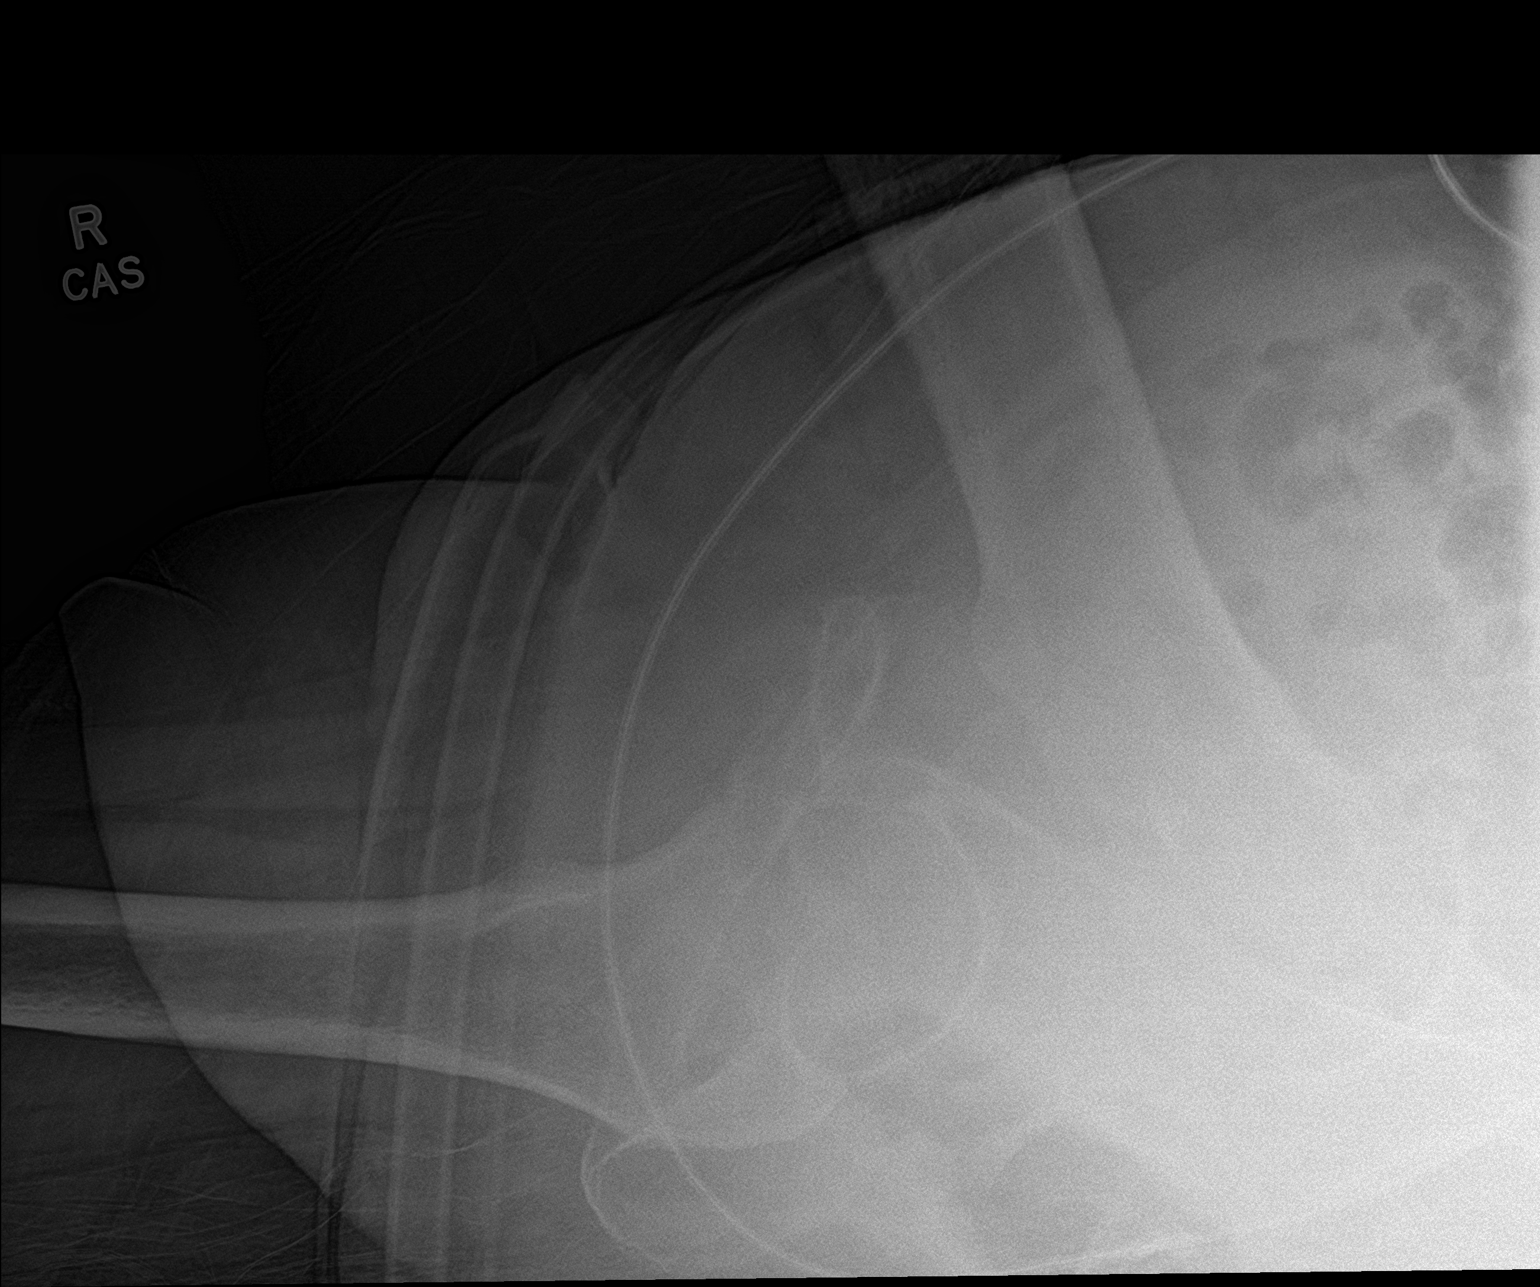

[3 of 3 positions shown; findings below may reference images not displayed]

FINDINGS: Pelvic ring is intact. Subcapital femoral neck fracture is noted
with impaction and angulation at the fracture site. No other
fractures are seen. No soft tissue changes are noted.
IMPRESSION: Subcapital right femoral neck fracture.

## 2019-07-12 IMAGING — CR DG CHEST 1V PORT
1 series · 1 of 1 positions shown · non-contrast
Comparison: 03/04/2018 and prior radiographs

CLINICAL DATA: Code sepsis.  Fever.

EXAM:
PORTABLE CHEST 1 VIEW

[portable]
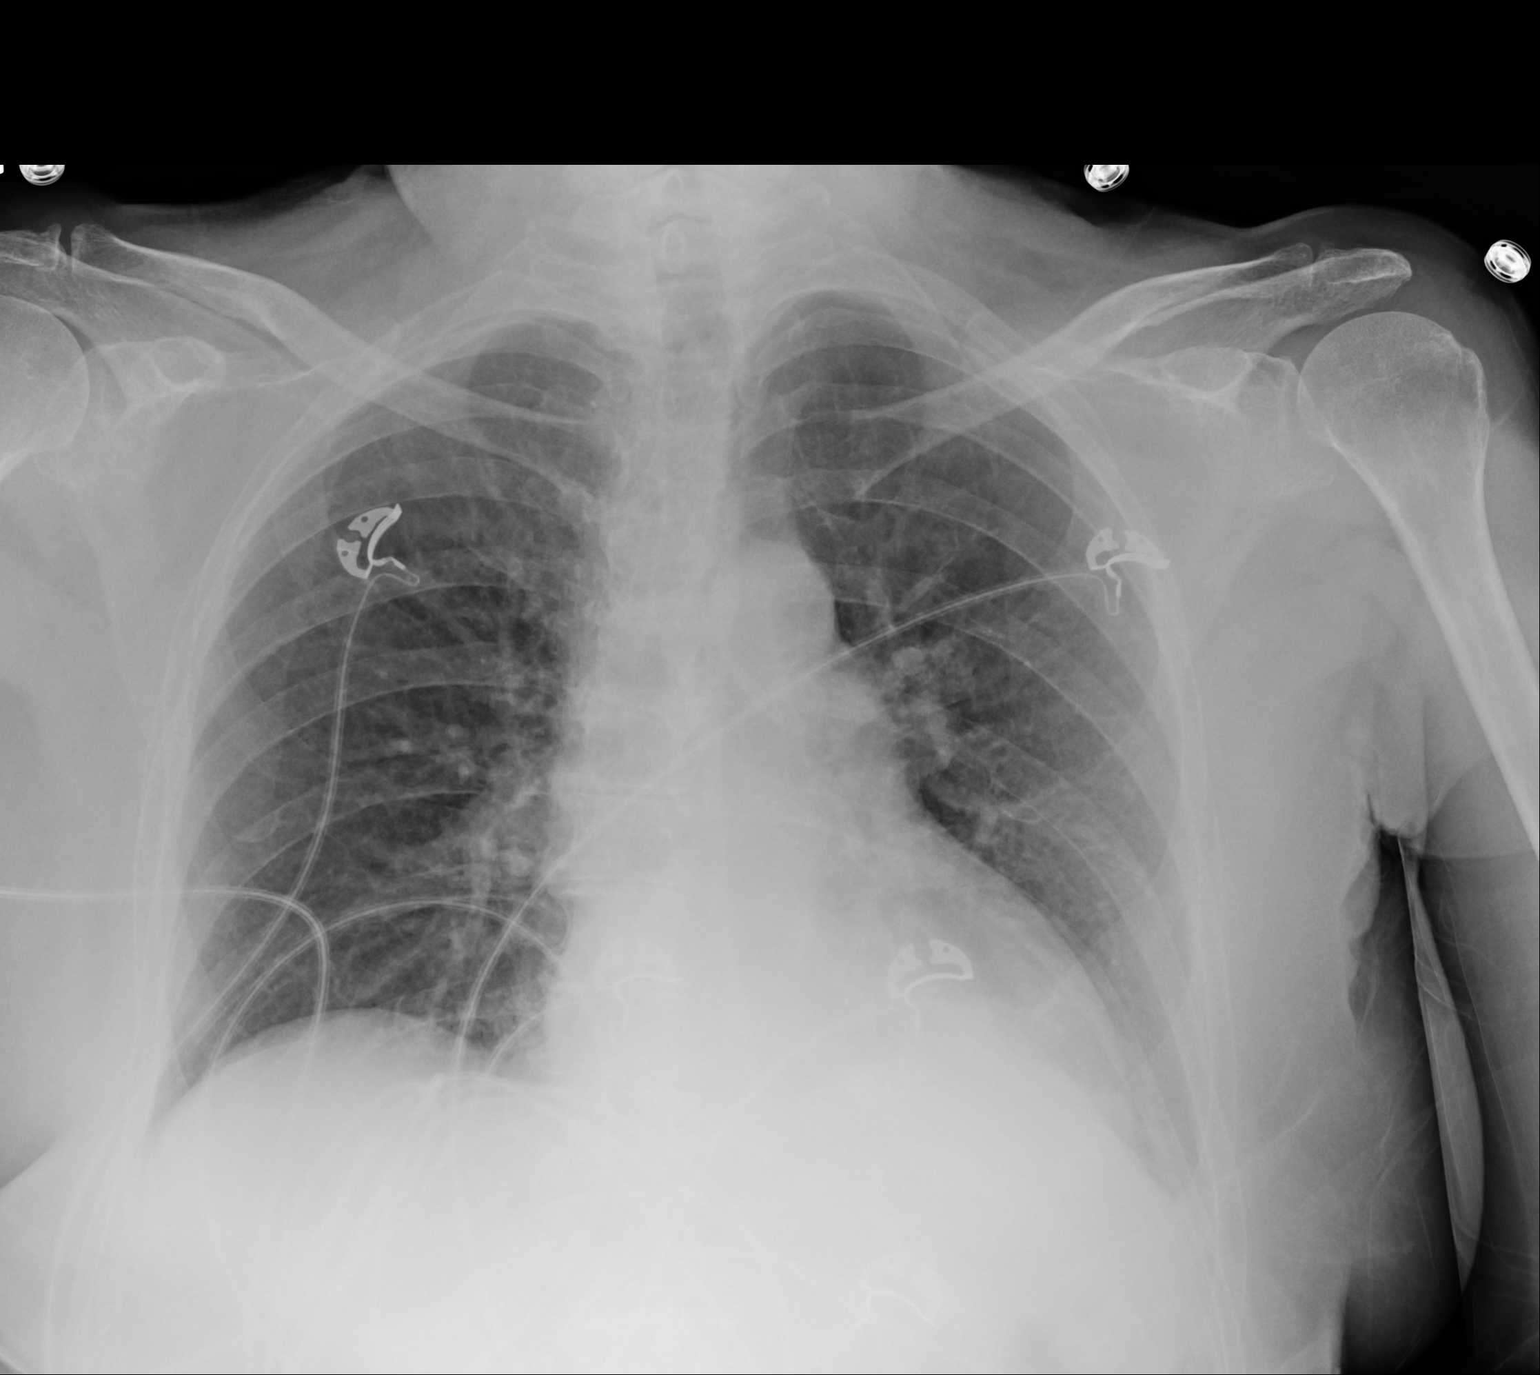

[1 of 1 positions shown; findings below may reference images not displayed]

FINDINGS: The cardiomediastinal silhouette is unremarkable.

Mild LEFT basilar opacity noted and may represent atelectasis or
airspace disease.

There is no evidence of pulmonary edema, suspicious pulmonary
nodule/mass, pleural effusion, or pneumothorax.

No acute bony abnormalities are identified.
IMPRESSION: Mild LEFT basilar opacity - slightly favor atelectasis over airspace
disease.
